# Patient Record
Sex: Female | Born: 1970
Health system: Southern US, Community
[De-identification: ages and names within clinical notes are randomized; demographics above are authoritative.]

## PROBLEM LIST (undated history)

## (undated) DIAGNOSIS — T7840XA Allergy, unspecified, initial encounter: Secondary | ICD-10-CM

## (undated) DIAGNOSIS — M1990 Unspecified osteoarthritis, unspecified site: Secondary | ICD-10-CM

## (undated) DIAGNOSIS — I10 Essential (primary) hypertension: Secondary | ICD-10-CM

## (undated) DIAGNOSIS — E785 Hyperlipidemia, unspecified: Secondary | ICD-10-CM

## (undated) HISTORY — DX: Allergy, unspecified, initial encounter: T78.40XA

## (undated) HISTORY — DX: Unspecified osteoarthritis, unspecified site: M19.90

## (undated) HISTORY — PX: ESOPHAGUS SURGERY: SHX626

## (undated) HISTORY — DX: Essential (primary) hypertension: I10

## (undated) HISTORY — PX: CERVIX SURGERY: SHX593

## (undated) HISTORY — DX: Hyperlipidemia, unspecified: E78.5

## (undated) HISTORY — PX: NO PAST SURGERIES: SHX2092

---

## 2004-03-14 ENCOUNTER — Ambulatory Visit: Payer: Self-pay | Admitting: Gastroenterology

## 2004-05-27 ENCOUNTER — Ambulatory Visit: Payer: Self-pay | Admitting: Gastroenterology

## 2006-06-27 ENCOUNTER — Emergency Department: Payer: Self-pay | Admitting: General Practice

## 2010-11-01 ENCOUNTER — Ambulatory Visit: Payer: Self-pay | Admitting: Family Medicine

## 2011-08-28 ENCOUNTER — Ambulatory Visit: Payer: Self-pay | Admitting: Family Medicine

## 2011-09-08 ENCOUNTER — Ambulatory Visit: Payer: Self-pay | Admitting: Family Medicine

## 2012-01-01 ENCOUNTER — Emergency Department: Payer: Self-pay | Admitting: Emergency Medicine

## 2012-01-01 LAB — PHOSPHORUS: Phosphorus: 1.2 mg/dL — ABNORMAL LOW (ref 2.5–4.9)

## 2012-01-01 LAB — CBC
MCHC: 32.4 g/dL (ref 32.0–36.0)
MCV: 82 fL (ref 80–100)
RDW: 16.2 % — ABNORMAL HIGH (ref 11.5–14.5)
WBC: 6.4 10*3/uL (ref 3.6–11.0)

## 2012-01-01 LAB — URINALYSIS, COMPLETE
Bilirubin,UR: NEGATIVE
Nitrite: NEGATIVE
Ph: 8 (ref 4.5–8.0)
Protein: 100
RBC,UR: 1 /HPF (ref 0–5)
WBC UR: 5 /HPF (ref 0–5)

## 2012-01-01 LAB — COMPREHENSIVE METABOLIC PANEL
Albumin: 3.8 g/dL (ref 3.4–5.0)
Alkaline Phosphatase: 75 U/L (ref 50–136)
BUN: 7 mg/dL (ref 7–18)
Bilirubin,Total: 0.5 mg/dL (ref 0.2–1.0)
Calcium, Total: 8.6 mg/dL (ref 8.5–10.1)
Chloride: 105 mmol/L (ref 98–107)
Co2: 22 mmol/L (ref 21–32)
Creatinine: 0.82 mg/dL (ref 0.60–1.30)
EGFR (African American): >60
EGFR (Non-African Amer.): >60
Glucose: 88 mg/dL (ref 65–99)
Potassium: 3.6 mmol/L (ref 3.5–5.1)
SGPT (ALT): 16 U/L (ref 12–78)
Total Protein: 7.9 g/dL (ref 6.4–8.2)

## 2012-01-01 LAB — TROPONIN I: Troponin-I: <0.02 ng/mL

## 2012-01-01 LAB — MAGNESIUM: Magnesium: 2.2 mg/dL

## 2012-03-25 ENCOUNTER — Ambulatory Visit: Payer: Self-pay | Admitting: General Surgery

## 2018-06-18 ENCOUNTER — Encounter: Payer: Self-pay | Admitting: Internal Medicine

## 2018-06-18 ENCOUNTER — Ambulatory Visit (INDEPENDENT_AMBULATORY_CARE_PROVIDER_SITE_OTHER): Payer: 59

## 2018-06-18 ENCOUNTER — Ambulatory Visit (INDEPENDENT_AMBULATORY_CARE_PROVIDER_SITE_OTHER): Payer: 59 | Admitting: Internal Medicine

## 2018-06-18 VITALS — BP 136/94 | HR 85 | Temp 98.5°F | Ht 65.5 in | Wt 284.4 lb

## 2018-06-18 DIAGNOSIS — J309 Allergic rhinitis, unspecified: Secondary | ICD-10-CM | POA: Insufficient documentation

## 2018-06-18 DIAGNOSIS — M25562 Pain in left knee: Secondary | ICD-10-CM

## 2018-06-18 DIAGNOSIS — Z124 Encounter for screening for malignant neoplasm of cervix: Secondary | ICD-10-CM | POA: Diagnosis not present

## 2018-06-18 DIAGNOSIS — Z1231 Encounter for screening mammogram for malignant neoplasm of breast: Secondary | ICD-10-CM | POA: Diagnosis not present

## 2018-06-18 DIAGNOSIS — G8929 Other chronic pain: Secondary | ICD-10-CM | POA: Insufficient documentation

## 2018-06-18 DIAGNOSIS — I10 Essential (primary) hypertension: Secondary | ICD-10-CM | POA: Diagnosis not present

## 2018-06-18 DIAGNOSIS — M25512 Pain in left shoulder: Secondary | ICD-10-CM

## 2018-06-18 DIAGNOSIS — M25561 Pain in right knee: Secondary | ICD-10-CM

## 2018-06-18 MED ORDER — MONTELUKAST SODIUM 10 MG PO TABS: 10.0000 mg | ORAL_TABLET | Freq: Every day | ORAL | 3 refills | Status: DC

## 2018-06-18 MED ORDER — ALBUTEROL SULFATE HFA 108 (90 BASE) MCG/ACT IN AERS: 1.0000 | INHALATION_SPRAY | Freq: Four times a day (QID) | RESPIRATORY_TRACT | 12 refills | Status: DC | PRN

## 2018-06-18 MED ORDER — LISINOPRIL-HYDROCHLOROTHIAZIDE 20-12.5 MG PO TABS: 1.0000 | ORAL_TABLET | Freq: Every day | ORAL | 1 refills | Status: DC

## 2018-06-18 MED ORDER — CETIRIZINE HCL 10 MG PO TABS: 10.0000 mg | ORAL_TABLET | Freq: Every day | ORAL | 3 refills | Status: DC

## 2018-06-18 NOTE — Progress Notes (Signed)
Pre visit review using our clinic review tool, if applicable. No additional management support is needed unless otherwise documented below in the visit note. 

## 2018-06-18 NOTE — Patient Instructions (Signed)
DASH Eating Plan DASH stands for "Dietary Approaches to Stop Hypertension." The DASH eating plan is a healthy eating plan that has been shown to reduce high blood pressure (hypertension). It may also reduce your risk for type 2 diabetes, heart disease, and stroke. The DASH eating plan may also help with weight loss. What are tips for following this plan?  General guidelines  Avoid eating more than 2,300 mg (milligrams) of salt (sodium) a day. If you have hypertension, you may need to reduce your sodium intake to 1,500 mg a day.  Limit alcohol intake to no more than 1 drink a day for nonpregnant women and 2 drinks a day for men. One drink equals 12 oz of beer, 5 oz of wine, or 1 oz of hard liquor.  Work with your health care provider to maintain a healthy body weight or to lose weight. Ask what an ideal weight is for you.  Get at least 30 minutes of exercise that causes your heart to beat faster (aerobic exercise) most days of the week. Activities may include walking, swimming, or biking.  Work with your health care provider or diet and nutrition specialist (dietitian) to adjust your eating plan to your individual calorie needs. Reading food labels   Check food labels for the amount of sodium per serving. Choose foods with less than 5 percent of the Daily Value of sodium. Generally, foods with less than 300 mg of sodium per serving fit into this eating plan.  To find whole grains, look for the word "whole" as the first word in the ingredient list. Shopping  Buy products labeled as "low-sodium" or "no salt added."  Buy fresh foods. Avoid canned foods and premade or frozen meals. Cooking  Avoid adding salt when cooking. Use salt-free seasonings or herbs instead of table salt or sea salt. Check with your health care provider or pharmacist before using salt substitutes.  Do not fry foods. Cook foods using healthy methods such as baking, boiling, grilling, and broiling instead.  Cook with  heart-healthy oils, such as olive, canola, soybean, or sunflower oil. Meal planning  Eat a balanced diet that includes: ? 5 or more servings of fruits and vegetables each day. At each meal, try to fill half of your plate with fruits and vegetables. ? Up to 6-8 servings of whole grains each day. ? Less than 6 oz of lean meat, poultry, or fish each day. A 3-oz serving of meat is about the same size as a deck of cards. One egg equals 1 oz. ? 2 servings of low-fat dairy each day. ? A serving of nuts, seeds, or beans 5 times each week. ? Heart-healthy fats. Healthy fats called Omega-3 fatty acids are found in foods such as flaxseeds and coldwater fish, like sardines, salmon, and mackerel.  Limit how much you eat of the following: ? Canned or prepackaged foods. ? Food that is high in trans fat, such as fried foods. ? Food that is high in saturated fat, such as fatty meat. ? Sweets, desserts, sugary drinks, and other foods with added sugar. ? Full-fat dairy products.  Do not salt foods before eating.  Try to eat at least 2 vegetarian meals each week.  Eat more home-cooked food and less restaurant, buffet, and fast food.  When eating at a restaurant, ask that your food be prepared with less salt or no salt, if possible. What foods are recommended? The items listed may not be a complete list. Talk with your dietitian about   what dietary choices are best for you. Grains Whole-grain or whole-wheat bread. Whole-grain or whole-wheat pasta. Brown rice. Oatmeal. Quinoa. Bulgur. Whole-grain and low-sodium cereals. Pita bread. Low-fat, low-sodium crackers. Whole-wheat flour tortillas. Vegetables Fresh or frozen vegetables (raw, steamed, roasted, or grilled). Low-sodium or reduced-sodium tomato and vegetable juice. Low-sodium or reduced-sodium tomato sauce and tomato paste. Low-sodium or reduced-sodium canned vegetables. Fruits All fresh, dried, or frozen fruit. Canned fruit in natural juice (without  added sugar). Meat and other protein foods Skinless chicken or turkey. Ground chicken or turkey. Pork with fat trimmed off. Fish and seafood. Egg whites. Dried beans, peas, or lentils. Unsalted nuts, nut butters, and seeds. Unsalted canned beans. Lean cuts of beef with fat trimmed off. Low-sodium, lean deli meat. Dairy Low-fat (1%) or fat-free (skim) milk. Fat-free, low-fat, or reduced-fat cheeses. Nonfat, low-sodium ricotta or cottage cheese. Low-fat or nonfat yogurt. Low-fat, low-sodium cheese. Fats and oils Soft margarine without trans fats. Vegetable oil. Low-fat, reduced-fat, or light mayonnaise and salad dressings (reduced-sodium). Canola, safflower, olive, soybean, and sunflower oils. Avocado. Seasoning and other foods Herbs. Spices. Seasoning mixes without salt. Unsalted popcorn and pretzels. Fat-free sweets. What foods are not recommended? The items listed may not be a complete list. Talk with your dietitian about what dietary choices are best for you. Grains Baked goods made with fat, such as croissants, muffins, or some breads. Dry pasta or rice meal packs. Vegetables Creamed or fried vegetables. Vegetables in a cheese sauce. Regular canned vegetables (not low-sodium or reduced-sodium). Regular canned tomato sauce and paste (not low-sodium or reduced-sodium). Regular tomato and vegetable juice (not low-sodium or reduced-sodium). Pickles. Olives. Fruits Canned fruit in a light or heavy syrup. Fried fruit. Fruit in cream or butter sauce. Meat and other protein foods Fatty cuts of meat. Ribs. Fried meat. Bacon. Sausage. Bologna and other processed lunch meats. Salami. Fatback. Hotdogs. Bratwurst. Salted nuts and seeds. Canned beans with added salt. Canned or smoked fish. Whole eggs or egg yolks. Chicken or turkey with skin. Dairy Whole or 2% milk, cream, and half-and-half. Whole or full-fat cream cheese. Whole-fat or sweetened yogurt. Full-fat cheese. Nondairy creamers. Whipped toppings.  Processed cheese and cheese spreads. Fats and oils Butter. Stick margarine. Lard. Shortening. Ghee. Bacon fat. Tropical oils, such as coconut, palm kernel, or palm oil. Seasoning and other foods Salted popcorn and pretzels. Onion salt, garlic salt, seasoned salt, table salt, and sea salt. Worcestershire sauce. Tartar sauce. Barbecue sauce. Teriyaki sauce. Soy sauce, including reduced-sodium. Steak sauce. Canned and packaged gravies. Fish sauce. Oyster sauce. Cocktail sauce. Horseradish that you find on the shelf. Ketchup. Mustard. Meat flavorings and tenderizers. Bouillon cubes. Hot sauce and Tabasco sauce. Premade or packaged marinades. Premade or packaged taco seasonings. Relishes. Regular salad dressings. Where to find more information:  National Heart, Lung, and Blood Institute: www.nhlbi.nih.gov  American Heart Association: www.heart.org Summary  The DASH eating plan is a healthy eating plan that has been shown to reduce high blood pressure (hypertension). It may also reduce your risk for type 2 diabetes, heart disease, and stroke.  With the DASH eating plan, you should limit salt (sodium) intake to 2,300 mg a day. If you have hypertension, you may need to reduce your sodium intake to 1,500 mg a day.  When on the DASH eating plan, aim to eat more fresh fruits and vegetables, whole grains, lean proteins, low-fat dairy, and heart-healthy fats.  Work with your health care provider or diet and nutrition specialist (dietitian) to adjust your eating plan to your   individual calorie needs. This information is not intended to replace advice given to you by your health care provider. Make sure you discuss any questions you have with your health care provider. Document Released: 04/24/2011 Document Revised: 04/28/2016 Document Reviewed: 04/28/2016 Elsevier Interactive Patient Education  2019 [employer].  Exercising to [employer] Exercise is structured, repetitive physical activity to improve  fitness and health. Getting regular exercise is important for everyone. It is especially important if you are overweight. Being overweight increases your risk of heart disease, stroke, diabetes, high blood pressure, and several types of cancer. Reducing your calorie intake and exercising can help you lose weight. Exercise is usually categorized as moderate or vigorous intensity. To lose weight, most people need to do a certain amount of moderate-intensity or vigorous-intensity exercise each week. Moderate-intensity exercise  Moderate-intensity exercise is any activity that gets you moving enough to burn at least three times more energy (calories) than if you were sitting. Examples of moderate exercise include:  Walking a mile in 15 minutes.  Doing light yard work.  Biking at an easy pace. Most people should get at least 150 minutes (2 hours and 30 minutes) a week of moderate-intensity exercise to maintain their body weight. Vigorous-intensity exercise Vigorous-intensity exercise is any activity that gets you moving enough to burn at least six times more calories than if you were sitting. When you exercise at this intensity, you should be working hard enough that you are not able to carry on a conversation. Examples of vigorous exercise include:  Running.  Playing a team sport, such as football, basketball, and soccer.  Jumping rope. Most people should get at least 75 minutes (1 hour and 15 minutes) a week of vigorous-intensity exercise to maintain their body weight. How can exercise affect me? When you exercise enough to burn more calories than you eat, you lose weight. Exercise also reduces body fat and builds muscle. The more muscle you have, the more calories you burn. Exercise also:  Improves mood.  Reduces stress and tension.  Improves your overall fitness, flexibility, and endurance.  Increases bone strength. The amount of exercise you need to lose weight depends on:  Your  age.  The type of exercise.  Any health conditions you have.  Your overall physical ability. Talk to your health care provider about how much exercise you need and what types of activities are safe for you. What actions can I take to lose weight? Nutrition   Make changes to your diet as told by your health care provider or diet and nutrition specialist (dietitian). This may include: ? Eating fewer calories. ? Eating more protein. ? Eating less unhealthy fats. ? Eating a diet that includes fresh fruits and vegetables, whole grains, low-fat dairy products, and lean protein. ? Avoiding foods with added fat, salt, and sugar.  Drink plenty of water while you exercise to prevent dehydration or heat stroke. Activity  Choose an activity that you enjoy and set realistic goals. Your health care provider can help you make an exercise plan that works for you.  Exercise at a moderate or vigorous intensity most days of the week. ? The intensity of exercise may vary from person to person. You can tell how intense a workout is for you by paying attention to your breathing and heartbeat. Most people will notice their breathing and heartbeat get faster with more intense exercise.  Do resistance training twice each week, such as: ? Push-ups. ? Sit-ups. ? Lifting weights. ? Using resistance  bands.  Getting short amounts of exercise can be just as helpful as long structured periods of exercise. If you have trouble finding time to exercise, try to include exercise in your daily routine. ? Get up, stretch, and walk around every 30 minutes throughout the day. ? Go for a walk during your lunch break. ? Park your car farther away from your destination. ? If you take public transportation, get off one stop early and walk the rest of the way. ? Make phone calls while standing up and walking around. ? Take the stairs instead of elevators or escalators.  Wear comfortable clothes and shoes with good  support.  Do not exercise so much that you hurt yourself, feel dizzy, or get very short of breath. Where to find more information  U.S. Department of Health and Human Services: [employer].at  Centers for Disease Control and Prevention (CDC): http://www.wolf.info/ Contact a health care provider:  Before starting a new exercise program.  If you have questions or concerns about your weight.  If you have a medical problem that keeps you from exercising. Get help right away if you have any of the following while exercising:  Injury.  Dizziness.  Difficulty breathing or shortness of breath that does not go away when you stop exercising.  Chest pain.  Rapid heartbeat. Summary  Being overweight increases your risk of heart disease, stroke, diabetes, high blood pressure, and several types of cancer.  Losing weight happens when you burn more calories than you eat.  Reducing the amount of calories you eat in addition to getting regular moderate or vigorous exercise each week helps you lose weight. This information is not intended to replace advice given to you by your health care provider. Make sure you discuss any questions you have with your health care provider. Document Released: 06/07/2010 Document Revised: 05/18/2017 Document Reviewed: 05/18/2017 Elsevier Interactive Patient Education  2019 [employer].

## 2018-06-18 NOTE — Progress Notes (Signed)
Chief Complaint  Patient presents with  . Establish Care   New Patient  1. HTN out of lis hctz 20/12.5 since 03/2018 needed PCP  2. Allergies needs refill of albuterol, singulair, zyrtec  3. C/o b/l knee pain and left shoulder pain worse last week 6/10 tried otc nsaid x 2 times    Review of Systems  Constitutional: Negative for weight loss.  HENT: Negative for hearing loss.   Eyes: Negative for blurred vision.  Respiratory: Negative for shortness of breath.   Cardiovascular: Negative for chest pain.  Gastrointestinal: Negative for abdominal pain.  Musculoskeletal: Positive for back pain and joint pain.  Skin: Negative for rash.  Neurological: Negative for headaches.  Psychiatric/Behavioral: Negative for depression.   Past Medical History:  Diagnosis Date  . Allergy   . Hypertension    Past Surgical History:  Procedure Laterality Date  . NO PAST SURGERIES     Family History  Problem Relation Age of Onset  . Hypertension Mother   . Hypertension Brother   . Hypertension Maternal Grandmother   . Hypertension Maternal Grandfather    Social History   Socioeconomic History  . Marital status: Married    Spouse name: Not on file  . Number of children: Not on file  . Years of education: Not on file  . Highest education level: Not on file  Occupational History  . Not on file  Social Needs  . Financial resource strain: Not on file  . Food insecurity:    Worry: Not on file    Inability: Not on file  . Transportation needs:    Medical: Not on file    Non-medical: Not on file  Tobacco Use  . Smoking status: Current Every Day Smoker  . Smokeless tobacco: Never Used  Substance and Sexual Activity  . Alcohol use: Not Currently  . Drug use: Not Currently  . Sexual activity: Yes    Comment: women   Lifestyle  . Physical activity:    Days per week: Not on file    Minutes per session: Not on file  . Stress: Not on file  Relationships  . Social connections:    Talks on  phone: Not on file    Gets together: Not on file    Attends religious service: Not on file    Active member of club or organization: Not on file    Attends meetings of clubs or organizations: Not on file    Relationship status: Not on file  . Intimate partner violence:    Fear of current or ex partner: Not on file    Emotionally abused: Not on file    Physically abused: Not on file    Forced sexual activity: Not on file  Other Topics Concern  . Not on file  Social History Narrative   Divorced    Prefers women   Smoker    Owns guns    Wears seat belt    Safe in relationship    Some college    Shop coordinator    Current Meds  Medication Sig  . albuterol (PROVENTIL HFA;VENTOLIN HFA) 108 (90 Base) MCG/ACT inhaler Inhale 1-2 puffs into the lungs every 6 (six) hours as needed for wheezing or shortness of breath.  . cetirizine (ZYRTEC) 10 MG tablet Take 1 tablet (10 mg total) by mouth daily.  . montelukast (SINGULAIR) 10 MG tablet Take 1 tablet (10 mg total) by mouth at bedtime.  . [DISCONTINUED] albuterol (PROVENTIL HFA;VENTOLIN HFA) 108 (90  Base) MCG/ACT inhaler Inhale 2 puffs into the lungs every 6 (six) hours as needed for wheezing or shortness of breath.  . [DISCONTINUED] cetirizine (ZYRTEC) 10 MG tablet Take 10 mg by mouth daily.  . [DISCONTINUED] LISINOPRIL-HYDROCHLOROTHIAZIDE PO Take 10-25 mg by mouth daily.  . [DISCONTINUED] montelukast (SINGULAIR) 10 MG tablet Take 10 mg by mouth at bedtime.   No Known Allergies No results found for this or any previous visit (from the past 2160 hour(s)). Objective  Body mass index is 46.61 kg/m. Wt Readings from Last 3 Encounters:  06/18/18 284 lb 6.4 oz (129 kg)   Temp Readings from Last 3 Encounters:  06/18/18 98.5 F (36.9 C) (Oral)   BP Readings from Last 3 Encounters:  06/18/18 (!) 136/94   Pulse Readings from Last 3 Encounters:  06/18/18 85    Physical Exam Vitals signs and nursing note reviewed.  Constitutional:       Appearance: Normal appearance. She is well-developed and well-groomed.  HENT:     Head: Normocephalic and atraumatic.     Nose: Nose normal.     Mouth/Throat:     Mouth: Mucous membranes are moist.     Pharynx: Oropharynx is clear.  Eyes:     Conjunctiva/sclera: Conjunctivae normal.     Pupils: Pupils are equal, round, and reactive to light.  Cardiovascular:     Rate and Rhythm: Normal rate and regular rhythm.     Heart sounds: Normal heart sounds. No murmur.  Pulmonary:     Effort: Pulmonary effort is normal.     Breath sounds: Normal breath sounds.  Skin:    General: Skin is warm and dry.  Neurological:     General: No focal deficit present.     Mental Status: She is alert and oriented to person, place, and time. Mental status is at baseline.     Gait: Gait normal.  Psychiatric:        Attention and Perception: Attention and perception normal.        Mood and Affect: Mood and affect normal.        Speech: Speech normal.        Behavior: Behavior normal. Behavior is cooperative.        Thought Content: Thought content normal.        Cognition and Memory: Cognition and memory normal.        Judgment: Judgment normal.     Assessment   1. htn  2. Allergic asthma  3.b/l knee pain and left shoulder pain  4. HM Plan   1. Refilled meds  2. Refilled meds did allergy shots in past  3 Xray left Knee rec prn tylenol  4.  Declines flu shot  Tdap had 06/08/14  Check MMR, hep B Declines STD check  Check CMET, CBC, lipid, TSH, T4, A1C, UA, vitamin D, MMR, hep B  Referred mammo due 08/18/2018  [facility]/gyn refer [facility]    Provider: Dr. Olivia Mackie McLean-Scocuzza-[facility]

## 2018-06-22 ENCOUNTER — Encounter: Payer: Self-pay | Admitting: Internal Medicine

## 2018-06-25 ENCOUNTER — Telehealth: Payer: Self-pay | Admitting: Internal Medicine

## 2018-06-25 DIAGNOSIS — E559 Vitamin D deficiency, unspecified: Secondary | ICD-10-CM

## 2018-06-25 MED ORDER — CHOLECALCIFEROL 1.25 MG (50000 UT) PO CAPS: 50000.0000 [IU] | ORAL_CAPSULE | ORAL | 1 refills | Status: DC

## 2018-06-25 NOTE — Telephone Encounter (Signed)
She is slightly anemic h/o 11.0/35.6  Liver kidneys normal  Urine normal  Cholesterol elevated TC 205, TG 148, HDL 39 low, LDL 136 high  -does she want to try cholesterol medication? -mail cholesterol handout  Protected mumps and measles  A1C 5.6  Protected hep B  Thyroid labs normal  Vitamin D low sent high dose D3 to pharmacy vitamin D 11.3   [place]

## 2018-07-01 NOTE — Telephone Encounter (Signed)
Pt returned call to office and given results and recommendations per Dr. Terese Door on 06/25/18. Pt verbalized understanding. Pt states she does want to try cholesterol medication and would like for prescription to be sent to [facility] on [address].   Pt also given xray results from 06/18/18 Notes recorded by McLean-Scocuzza, Nino Glow, MD on 06/21/2018 at 8:12 AM EST Moderate osteoarthritis in left knee  -does she want to see orthopedics?  She can try otc tylenol as needed, Tumeric capsules or glucosamine chondroitin  -does she want to try topical voltaren gel?   Pt states she does not want to see an orthopedic doctor at this time. Pt states she will try the otc medications and would also like to try the topical Voltaren gel.

## 2018-07-02 ENCOUNTER — Other Ambulatory Visit: Payer: Self-pay | Admitting: Internal Medicine

## 2018-07-02 DIAGNOSIS — E785 Hyperlipidemia, unspecified: Secondary | ICD-10-CM

## 2018-07-02 DIAGNOSIS — M1710 Unilateral primary osteoarthritis, unspecified knee: Secondary | ICD-10-CM

## 2018-07-02 MED ORDER — DICLOFENAC SODIUM 1 % TD GEL: 4.0000 g | Freq: Four times a day (QID) | TRANSDERMAL | 3 refills | Status: DC

## 2018-07-02 MED ORDER — ATORVASTATIN CALCIUM 10 MG PO TABS: 10.0000 mg | ORAL_TABLET | Freq: Every day | ORAL | 3 refills | Status: DC

## 2018-08-23 LAB — HM PAP SMEAR: HM Pap smear: NORMAL

## 2018-09-16 ENCOUNTER — Ambulatory Visit: Payer: 59 | Admitting: Internal Medicine

## 2018-09-16 ENCOUNTER — Ambulatory Visit (INDEPENDENT_AMBULATORY_CARE_PROVIDER_SITE_OTHER): Payer: 59 | Admitting: Internal Medicine

## 2018-09-16 DIAGNOSIS — Z1159 Encounter for screening for other viral diseases: Secondary | ICD-10-CM

## 2018-09-16 DIAGNOSIS — E559 Vitamin D deficiency, unspecified: Secondary | ICD-10-CM | POA: Diagnosis not present

## 2018-09-16 DIAGNOSIS — M25562 Pain in left knee: Secondary | ICD-10-CM

## 2018-09-16 DIAGNOSIS — K219 Gastro-esophageal reflux disease without esophagitis: Secondary | ICD-10-CM | POA: Diagnosis not present

## 2018-09-16 DIAGNOSIS — M25561 Pain in right knee: Secondary | ICD-10-CM | POA: Diagnosis not present

## 2018-09-16 DIAGNOSIS — I10 Essential (primary) hypertension: Secondary | ICD-10-CM

## 2018-09-16 DIAGNOSIS — E785 Hyperlipidemia, unspecified: Secondary | ICD-10-CM

## 2018-09-16 DIAGNOSIS — M25512 Pain in left shoulder: Secondary | ICD-10-CM

## 2018-09-16 DIAGNOSIS — D649 Anemia, unspecified: Secondary | ICD-10-CM

## 2018-09-16 MED ORDER — FAMOTIDINE 20 MG PO TABS: 20.0000 mg | ORAL_TABLET | Freq: Every day | ORAL | 3 refills | Status: DC

## 2018-09-17 DIAGNOSIS — D649 Anemia, unspecified: Secondary | ICD-10-CM | POA: Insufficient documentation

## 2018-09-17 DIAGNOSIS — E785 Hyperlipidemia, unspecified: Secondary | ICD-10-CM | POA: Insufficient documentation

## 2018-09-17 NOTE — Progress Notes (Signed)
Failed Doxy telephone note   I connected with Sally Harmon   on 09/16/18 at  3:15 PM EDT by telephone nd verified that I am speaking with the correct person using two identifiers.  Location patient: work Location provider:home Persons participating in the virtual visit: patient, provider  I discussed the limitations of evaluation and management by telemedicine and the availability of in person appointments. The patient expressed understanding and agreed to proceed.   HPI: HTN on zestoretic 20-12.5 w/o sxs of h/a or dizziness BP 08/23/2018 was 132/90 at ob/gyn appt she is taking meds  -reviewed labs 06/22/2018 [facility] mailed cholesterol handout today   GERD tried samples of Pepcid AC ? Dose and wants Rx she used to be on Nexium and Prevacid which helped she is trying to eat healthy (I.e eggs, bagels, protein shakes) and lose weight doing herbal life and down 5 lbs over the last 1 month.   Left Shoulder pain better after PT showed her exercises at work and moderate OA left knee better for now   ROS: See pertinent positives and negatives per HPI.  Past Medical History:  Diagnosis Date  . Allergy   . Hypertension     Past Surgical History:  Procedure Laterality Date  . NO PAST SURGERIES      Family History  Problem Relation Age of Onset  . Hypertension Mother   . Hypertension Brother   . Hypertension Maternal Grandmother   . Hypertension Maternal Grandfather     SOCIAL HX: 1 son  Working    Current Outpatient Medications:  .  albuterol (PROVENTIL HFA;VENTOLIN HFA) 108 (90 Base) MCG/ACT inhaler, Inhale 1-2 puffs into the lungs every 6 (six) hours as needed for wheezing or shortness of breath., Disp: 1 Inhaler, Rfl: 12 .  atorvastatin (LIPITOR) 10 MG tablet, Take 1 tablet (10 mg total) by mouth daily at 6 PM. At night, Disp: 90 tablet, Rfl: 3 .  cetirizine (ZYRTEC) 10 MG tablet, Take 1 tablet (10 mg total) by mouth daily., Disp: 90 tablet, Rfl: 3 .  Cholecalciferol 1.25 MG (50000  UT) capsule, Take 1 capsule (50,000 Units total) by mouth once a week., Disp: 13 capsule, Rfl: 1 .  diclofenac sodium (VOLTAREN) 1 % GEL, Apply 4 g topically 4 (four) times daily. Left knee arthritis, Disp: 100 g, Rfl: 3 .  famotidine (PEPCID) 20 MG tablet, Take 1 tablet (20 mg total) by mouth daily. In am, Disp: 90 tablet, Rfl: 3 .  lisinopril-hydrochlorothiazide (PRINZIDE,ZESTORETIC) 20-12.5 MG tablet, Take 1 tablet by mouth daily., Disp: 90 tablet, Rfl: 1 .  montelukast (SINGULAIR) 10 MG tablet, Take 1 tablet (10 mg total) by mouth at bedtime., Disp: 90 tablet, Rfl: 3  EXAM: able to see pt before failed video   VITALS per patient if applicable:  GENERAL: alert, oriented, appears well and in no acute distress  HEENT: atraumatic, conjunttiva clear, no obvious abnormalities on inspection of external nose and ears  NECK: normal movements of the head and neck  LUNGS: on inspection no signs of respiratory distress, breathing rate appears normal, no obvious gross SOB, gasping or wheezing  CV: no obvious cyanosis  MS: moves all visible extremities without noticeable abnormality  PSYCH/NEURO: pleasant and cooperative, no obvious depression or anxiety, speech and thought processing grossly intact  ASSESSMENT AND PLAN:  Discussed the following assessment and plan:  Essential hypertension -cont meds for now   Gastroesophageal reflux disease, esophagitis presence not specified - Plan: famotidine (PEPCID) 20 MG tablet qd consider  bid if this does not help was on prevacid and nexium in the past consider add ppi if bid H2 blocker not effective pt will let me know about 1x per day  Vitamin D deficiency  Pain in both knees, unspecified chronicity  Left shoulder pain, unspecified chronicity  HM Declines flu shot  Tdap had 06/08/14  Immune MMR, hep B qual + consider quant in future   Declines STD check  Referred mammo due 08/18/2018  Per pt this was resch 2/2 COVID June/july 2020  Pap  08/23/2018 [facility] neg pap neg HPV due in 3 years     I discussed the assessment and treatment plan with the patient. The patient was provided an opportunity to ask questions and all were answered. The patient agreed with the plan and demonstrated an understanding of the instructions.   The patient was advised to call back or seek an in-person evaluation if the symptoms worsen or if the condition fails to improve as anticipated.  Time spent 20 minutes  Delorise Jackson, MD

## 2018-09-17 NOTE — Patient Instructions (Signed)
Vitamin D Deficiency Vitamin D deficiency is when your body does not have enough vitamin D. Vitamin D is important to your body for many reasons:  It helps the body to absorb two important minerals, called calcium and phosphorus.  It plays a role in bone health.  It may help to prevent some diseases, such as diabetes and multiple sclerosis.  It plays a role in muscle function, including heart function. You can get vitamin D by:  Eating foods that naturally contain vitamin D.  Eating or drinking milk or other dairy products that have vitamin D added to them.  Taking a vitamin D supplement or a multivitamin supplement that contains vitamin D.  Being in the sun. Your body naturally makes vitamin D when your skin is exposed to sunlight. Your body changes the sunlight into a form of the vitamin that the body can use. If vitamin D deficiency is severe, it can cause a condition in which your bones become soft. In adults, this condition is called osteomalacia. In children, this condition is called rickets. What are the causes? Vitamin D deficiency may be caused by:  Not eating enough foods that contain vitamin D.  Not getting enough sun exposure.  Having certain digestive system diseases that make it difficult for your body to absorb vitamin D. These diseases include Crohn disease, chronic pancreatitis, and cystic fibrosis.  Having a surgery in which a part of the stomach or a part of the small intestine is removed.  Being obese.  Having chronic kidney disease or liver disease. What increases the risk? This condition is more likely to develop in:  Older people.  People who do not spend much time outdoors.  People who live in a long-term care facility.  People who have had broken bones.  People with weak or thin bones (osteoporosis).  People who have a disease or condition that changes how the body absorbs vitamin D.  People who have dark skin.  People who take certain  medicines, such as steroid medicines or certain seizure medicines.  People who are overweight or obese. What are the signs or symptoms? In mild cases of vitamin D deficiency, there may not be any symptoms. If the condition is severe, symptoms may include:  Bone pain.  Muscle pain.  Falling often.  Broken bones caused by a minor injury. How is this diagnosed? This condition is usually diagnosed with a blood test. How is this treated? Treatment for this condition may depend on what caused the condition. Treatment options include:  Taking vitamin D supplements.  Taking a calcium supplement. Your health care provider will suggest what dose is best for you. Follow these instructions at home:  Take medicines and supplements only as told by your health care provider.  Eat foods that contain vitamin D. Choices include: ? Fortified dairy products, cereals, or juices. Fortified means that vitamin D has been added to the food. Check the label on the package to be sure. ? Fatty fish, such as salmon or trout. ? Eggs. ? Oysters.  Do not use a tanning bed.  Maintain a healthy weight. Lose weight, if needed.  Keep all follow-up visits as told by your health care provider. This is important. Contact a health care provider if:  Your symptoms do not go away.  You feel like throwing up (nausea) or you throw up (vomit).  You have fewer bowel movements than usual or it is difficult for you to have a bowel movement (constipation). This information is   not intended to replace advice given to you by your health care provider. Make sure you discuss any questions you have with your health care provider. Document Released: 07/28/2011 Document Revised: 10/17/2015 Document Reviewed: 09/20/2014 Elsevier Interactive Patient Education  2019 [employer].  Gastroesophageal Reflux Disease, Adult Gastroesophageal reflux (GER) happens when acid from the stomach flows up into the tube that connects the  mouth and the stomach (esophagus). Normally, food travels down the esophagus and stays in the stomach to be digested. However, when a person has GER, food and stomach acid sometimes move back up into the esophagus. If this becomes a more serious problem, the person may be diagnosed with a disease called gastroesophageal reflux disease (GERD). GERD occurs when the reflux:  Happens often.  Causes frequent or severe symptoms.  Causes problems such as damage to the esophagus. When stomach acid comes in contact with the esophagus, the acid may cause soreness (inflammation) in the esophagus. Over time, GERD may create small holes (ulcers) in the lining of the esophagus. What are the causes? This condition is caused by a problem with the muscle between the esophagus and the stomach (lower esophageal sphincter, or LES). Normally, the LES muscle closes after food passes through the esophagus to the stomach. When the LES is weakened or abnormal, it does not close properly, and that allows food and stomach acid to go back up into the esophagus. The LES can be weakened by certain dietary substances, medicines, and medical conditions, including:  Tobacco use.  Pregnancy.  Having a hiatal hernia.  Alcohol use.  Certain foods and beverages, such as coffee, chocolate, onions, and peppermint. What increases the risk? You are more likely to develop this condition if you:  Have an increased body weight.  Have a connective tissue disorder.  Use NSAID medicines. What are the signs or symptoms? Symptoms of this condition include:  Heartburn.  Difficult or painful swallowing.  The feeling of having a lump in the throat.  Abitter taste in the mouth.  Bad breath.  Having a large amount of saliva.  Having an upset or bloated stomach.  Belching.  Chest pain. Different conditions can cause chest pain. Make sure you see your health care provider if you experience chest pain.  Shortness of  breath or wheezing.  Ongoing (chronic) cough or a night-time cough.  Wearing away of tooth enamel.  Weight loss. How is this diagnosed? Your health care provider will take a medical history and perform a physical exam. To determine if you have mild or severe GERD, your health care provider may also monitor how you respond to treatment. You may also have tests, including:  A test to examine your stomach and esophagus with a small camera (endoscopy).  A test thatmeasures the acidity level in your esophagus.  A test thatmeasures how much pressure is on your esophagus.  A barium swallow or modified barium swallow test to show the shape, size, and functioning of your esophagus. How is this treated? The goal of treatment is to help relieve your symptoms and to prevent complications. Treatment for this condition may vary depending on how severe your symptoms are. Your health care provider may recommend:  Changes to your diet.  Medicine.  Surgery. Follow these instructions at home: Eating and drinking   Follow a diet as recommended by your health care provider. This may involve avoiding foods and drinks such as: ? Coffee and tea (with or without caffeine). ? Drinks that containalcohol. ? Energy drinks and  sports drinks. ? Carbonated drinks or sodas. ? Chocolate and cocoa. ? Peppermint and mint flavorings. ? Garlic and onions. ? Horseradish. ? Spicy and acidic foods, including peppers, chili powder, curry powder, vinegar, hot sauces, and barbecue sauce. ? Citrus fruit juices and citrus fruits, such as oranges, lemons, and limes. ? Tomato-based foods, such as red sauce, chili, salsa, and pizza with red sauce. ? Fried and fatty foods, such as donuts, french fries, potato chips, and high-fat dressings. ? High-fat meats, such as hot dogs and fatty cuts of red and white meats, such as rib eye steak, sausage, ham, and bacon. ? High-fat dairy items, such as whole milk, butter, and cream  cheese.  Eat small, frequent meals instead of large meals.  Avoid drinking large amounts of liquid with your meals.  Avoid eating meals during the 2-3 hours before bedtime.  Avoid lying down right after you eat.  Do not exercise right after you eat. Lifestyle   Do not use any products that contain nicotine or tobacco, such as cigarettes, e-cigarettes, and chewing tobacco. If you need help quitting, ask your health care provider.  Try to reduce your stress by using methods such as yoga or meditation. If you need help reducing stress, ask your health care provider.  If you are overweight, reduce your weight to an amount that is healthy for you. Ask your health care provider for guidance about a safe weight loss goal. General instructions  Pay attention to any changes in your symptoms.  Take over-the-counter and prescription medicines only as told by your health care provider. Do not take aspirin, ibuprofen, or other NSAIDs unless your health care provider told you to do so.  Wear loose-fitting clothing. Do not wear anything tight around your waist that causes pressure on your abdomen.  Raise (elevate) the head of your bed about 6 inches (15 cm).  Avoid bending over if this makes your symptoms worse.  Keep all follow-up visits as told by your health care provider. This is important. Contact a health care provider if:  You have: ? New symptoms. ? Unexplained weight loss. ? Difficulty swallowing or it hurts to swallow. ? Wheezing or a persistent cough. ? A hoarse voice.  Your symptoms do not improve with treatment. Get help right away if you:  Have pain in your arms, neck, jaw, teeth, or back.  Feel sweaty, dizzy, or light-headed.  Have chest pain or shortness of breath.  Vomit and your vomit looks like blood or coffee grounds.  Faint.  Have stool that is bloody or black.  Cannot swallow, drink, or eat. Summary  Gastroesophageal reflux happens when acid from the  stomach flows up into the esophagus. GERD is a disease in which the reflux happens often, causes frequent or severe symptoms, or causes problems such as damage to the esophagus.  Treatment for this condition may vary depending on how severe your symptoms are. Your health care provider may recommend diet and lifestyle changes, medicine, or surgery.  Contact a health care provider if you have new or worsening symptoms.  Take over-the-counter and prescription medicines only as told by your health care provider. Do not take aspirin, ibuprofen, or other NSAIDs unless your health care provider told you to do so.  Keep all follow-up visits as told by your health care provider. This is important. This information is not intended to replace advice given to you by your health care provider. Make sure you discuss any questions you have with your health  care provider. Document Released: 02/12/2005 Document Revised: 11/11/2017 Document Reviewed: 11/11/2017 Elsevier Interactive Patient Education  2019 [place] for Gastroesophageal Reflux Disease, Adult When you have gastroesophageal reflux disease (GERD), the foods you eat and your eating habits are very important. Choosing the right foods can help ease your discomfort. Think about working with a nutrition specialist (dietitian) to help you make good choices. What are tips for following this plan?  Meals  Choose healthy foods that are low in fat, such as fruits, vegetables, whole grains, low-fat dairy products, and lean meat, fish, and poultry.  Eat small meals often instead of 3 large meals a day. Eat your meals slowly, and in a place where you are relaxed. Avoid bending over or lying down until 2-3 hours after eating.  Avoid eating meals 2-3 hours before bed.  Avoid drinking a lot of liquid with meals.  Cook foods using methods other than frying. Bake, grill, or broil food instead.  Avoid or limit: ? Chocolate. ? Peppermint or  spearmint. ? Alcohol. ? Pepper. ? Black and decaffeinated coffee. ? Black and decaffeinated tea. ? Bubbly (carbonated) soft drinks. ? Caffeinated energy drinks and soft drinks.  Limit high-fat foods such as: ? Fatty meat or fried foods. ? Whole milk, cream, butter, or ice cream. ? Nuts and nut butters. ? Pastries, donuts, and sweets made with butter or shortening.  Avoid foods that cause symptoms. These foods may be different for everyone. Common foods that cause symptoms include: ? Tomatoes. ? Oranges, lemons, and limes. ? Peppers. ? Spicy food. ? Onions and garlic. ? Vinegar. Lifestyle  Maintain a healthy weight. Ask your doctor what weight is healthy for you. If you need to lose weight, work with your doctor to do so safely.  Exercise for at least 30 minutes for 5 or more days each week, or as told by your doctor.  Wear loose-fitting clothes.  Do not smoke. If you need help quitting, ask your doctor.  Sleep with the head of your bed higher than your feet. Use a wedge under the mattress or blocks under the bed frame to raise the head of the bed. Summary  When you have gastroesophageal reflux disease (GERD), food and lifestyle choices are very important in easing your symptoms.  Eat small meals often instead of 3 large meals a day. Eat your meals slowly, and in a place where you are relaxed.  Limit high-fat foods such as fatty meat or fried foods.  Avoid bending over or lying down until 2-3 hours after eating.  Avoid peppermint and spearmint, caffeine, alcohol, and chocolate. This information is not intended to replace advice given to you by your health care provider. Make sure you discuss any questions you have with your health care provider. Document Released: 11/04/2011 Document Revised: 06/10/2016 Document Reviewed: 06/10/2016 Elsevier Interactive Patient Education  2019 [employer].

## 2018-12-08 DIAGNOSIS — Z1231 Encounter for screening mammogram for malignant neoplasm of breast: Secondary | ICD-10-CM | POA: Diagnosis not present

## 2018-12-08 LAB — HM MAMMOGRAPHY

## 2018-12-16 ENCOUNTER — Other Ambulatory Visit: Payer: Self-pay | Admitting: Internal Medicine

## 2018-12-16 DIAGNOSIS — I10 Essential (primary) hypertension: Secondary | ICD-10-CM

## 2018-12-16 MED ORDER — LISINOPRIL-HYDROCHLOROTHIAZIDE 20-12.5 MG PO TABS: 1.0000 | ORAL_TABLET | Freq: Every day | ORAL | 1 refills | Status: DC

## 2018-12-28 ENCOUNTER — Encounter: Payer: Self-pay | Admitting: Internal Medicine

## 2019-02-04 ENCOUNTER — Telehealth: Payer: Self-pay

## 2019-02-04 NOTE — Telephone Encounter (Signed)
mychart sent to inform patient. 

## 2019-02-04 NOTE — Telephone Encounter (Signed)
Copied from [place] [id number]. Topic: General - Inquiry >> Feb 04, 2019  3:47 PM Richardo Priest, [place] wrote: Reason for CRM: Patient called in stating her boil on her left nipple has returned and she would like the same antibiotic given, as before. Please advise. Call back is [phone].

## 2019-02-04 NOTE — Telephone Encounter (Signed)
She needs in person appt   [place]

## 2019-02-07 ENCOUNTER — Telehealth: Payer: Self-pay

## 2019-02-07 ENCOUNTER — Telehealth: Payer: Self-pay | Admitting: Internal Medicine

## 2019-02-07 ENCOUNTER — Other Ambulatory Visit: Payer: Self-pay

## 2019-02-07 NOTE — Telephone Encounter (Signed)
Copied from [place] [id number]. Topic: General - Inquiry >> Feb 04, 2019  3:47 PM Sally Harmon, [place] wrote: Reason for CRM: Patient called in stating her boil on her left nipple has returned and she would like the same antibiotic given, as before. Please advise. Call back is [phone]. >> Feb 04, 2019  5:17 PM [name], [name] wrote: Pt returned call to the office to schedule an appt with Dr. Olivia Mackie but there is no appt available for next week. Pt requests call back.

## 2019-02-07 NOTE — Telephone Encounter (Signed)
Needs in person visit see if another provider has availability for breast exam and further work up  (I.e [place] +/- mammogram) asap Forward message to provider pt is seeing   Thanks [place]

## 2019-02-07 NOTE — Telephone Encounter (Signed)
Duplicate message. Patient needs appointment. pcp not giving abx without being seen

## 2019-02-07 NOTE — Telephone Encounter (Signed)
Copied from [place] [id number]. Topic: Quick Communication - Rx Refill/Question >> Feb 07, 2019  9:35 AM Rainey Pines A wrote: Medication: Clindamycin (Patient would like a refill on medication for boil. Patient does not have appointment until 9/29. Patient stated that this is the same medication prescribed for her for the boil previously.)  Has the patient contacted their pharmacy? {Yes (Agent: If no, request that the patient contact the pharmacy for the refill.) (Agent: If yes, when and what did the pharmacy advise?)Contact PCP  Preferred Pharmacy (with phone number or street name): [id number] - [place], [place] - [address] [phone] (Phone) [phone] (Fax)    Agent: Please be advised that RX refills may take up to 3 business days. We ask that you follow-up with your pharmacy.

## 2019-02-07 NOTE — Telephone Encounter (Signed)
Is there somewhere you want to place her or do you want there to see another provider.

## 2019-02-07 NOTE — Telephone Encounter (Signed)
Patient requesting Clindamycin. Patient states that she has another boil. Please advise  [facility] [place], [place]

## 2019-02-08 ENCOUNTER — Other Ambulatory Visit: Payer: Self-pay

## 2019-02-08 ENCOUNTER — Ambulatory Visit (INDEPENDENT_AMBULATORY_CARE_PROVIDER_SITE_OTHER): Payer: BC Managed Care – PPO | Admitting: Internal Medicine

## 2019-02-08 VITALS — BP 168/96 | HR 89 | Temp 98.5°F | Ht 65.0 in | Wt 287.4 lb

## 2019-02-08 DIAGNOSIS — R87618 Other abnormal cytological findings on specimens from cervix uteri: Secondary | ICD-10-CM | POA: Diagnosis not present

## 2019-02-08 DIAGNOSIS — I10 Essential (primary) hypertension: Secondary | ICD-10-CM | POA: Diagnosis not present

## 2019-02-08 DIAGNOSIS — N61 Mastitis without abscess: Secondary | ICD-10-CM

## 2019-02-08 MED ORDER — LISINOPRIL-HYDROCHLOROTHIAZIDE 20-12.5 MG PO TABS: 2.0000 | ORAL_TABLET | Freq: Every day | ORAL | 1 refills | Status: DC

## 2019-02-08 MED ORDER — MUPIROCIN 2 % EX OINT: 1.0000 "application " | TOPICAL_OINTMENT | Freq: Two times a day (BID) | CUTANEOUS | 0 refills | Status: DC

## 2019-02-08 MED ORDER — DOXYCYCLINE HYCLATE 100 MG PO TABS: 100.0000 mg | ORAL_TABLET | Freq: Two times a day (BID) | ORAL | 0 refills | Status: DC

## 2019-02-08 NOTE — Progress Notes (Signed)
Chief Complaint  Patient presents with  . Follow-up   Left nipple pain x 1-2 weeks and 2 years ago had abscess on nipple given abx and topical Abx which helped but sx's returned and here to check out   HTN BP elevated today on lis 20-12.5 hctz   Review of Systems  Constitutional: Negative for weight loss.  HENT: Negative for hearing loss.   Eyes: Negative for blurred vision.  Respiratory: Negative for cough.   Cardiovascular: Negative for chest pain.  Gastrointestinal: Negative for abdominal pain.  Genitourinary:       +left nipple pain    Skin: Negative for rash.  Neurological: Negative for headaches.  Psychiatric/Behavioral: Negative for depression.   Past Medical History:  Diagnosis Date  . Allergy   . Hypertension    Past Surgical History:  Procedure Laterality Date  . NO PAST SURGERIES     Family History  Problem Relation Age of Onset  . Hypertension Mother   . Hypertension Brother   . Hypertension Maternal Grandmother   . Hypertension Maternal Grandfather    Social History   Socioeconomic History  . Marital status: Married    Spouse name: Not on file  . Number of children: Not on file  . Years of education: Not on file  . Highest education level: Not on file  Occupational History  . Not on file  Social Needs  . Financial resource strain: Not on file  . Food insecurity    Worry: Not on file    Inability: Not on file  . Transportation needs    Medical: Not on file    Non-medical: Not on file  Tobacco Use  . Smoking status: Current Every Day Smoker  . Smokeless tobacco: Never Used  Substance and Sexual Activity  . Alcohol use: Not Currently  . Drug use: Not Currently  . Sexual activity: Yes    Comment: women   Lifestyle  . Physical activity    Days per week: Not on file    Minutes per session: Not on file  . Stress: Not on file  Relationships  . Social Herbalist on phone: Not on file    Gets together: Not on file    Attends  religious service: Not on file    Active member of club or organization: Not on file    Attends meetings of clubs or organizations: Not on file    Relationship status: Not on file  . Intimate partner violence    Fear of current or ex partner: Not on file    Emotionally abused: Not on file    Physically abused: Not on file    Forced sexual activity: Not on file  Other Topics Concern  . Not on file  Social History Narrative   Divorced    Prefers women   Smoker    Owns guns    Wears seat belt    Safe in relationship    Some college    Shop coordinator    Current Meds  Medication Sig  . albuterol (PROVENTIL HFA;VENTOLIN HFA) 108 (90 Base) MCG/ACT inhaler Inhale 1-2 puffs into the lungs every 6 (six) hours as needed for wheezing or shortness of breath.  Marland Kitchen atorvastatin (LIPITOR) 10 MG tablet Take 1 tablet (10 mg total) by mouth daily at 6 PM. At night  . cetirizine (ZYRTEC) 10 MG tablet Take 1 tablet (10 mg total) by mouth daily.  . Cholecalciferol 1.25 MG (50000 UT) capsule Take  1 capsule (50,000 Units total) by mouth once a week.  . diclofenac sodium (VOLTAREN) 1 % GEL Apply 4 g topically 4 (four) times daily. Left knee arthritis  . famotidine (PEPCID) 20 MG tablet Take 1 tablet (20 mg total) by mouth daily. In am  . lisinopril-hydrochlorothiazide (ZESTORETIC) 20-12.5 MG tablet Take 2 tablets by mouth daily.  . montelukast (SINGULAIR) 10 MG tablet Take 1 tablet (10 mg total) by mouth at bedtime.  . [DISCONTINUED] lisinopril-hydrochlorothiazide (ZESTORETIC) 20-12.5 MG tablet Take 1 tablet by mouth daily.   No Known Allergies Recent Results (from the past 2160 hour(s))  HM MAMMOGRAPHY     Status: None   Collection Time: 12/08/18 12:00 AM  Result Value Ref Range   HM Mammogram 0-4 Bi-Rad 0-4 Bi-Rad, Self Reported Normal    Comment: negative [facility] 12/08/18    Objective  Body mass index is 47.83 kg/m. Wt Readings from Last 3 Encounters:  02/08/19 287 lb 6.4 oz (130.4 kg)   06/18/18 284 lb 6.4 oz (129 kg)   Temp Readings from Last 3 Encounters:  02/08/19 98.5 F (36.9 C) (Oral)  06/18/18 98.5 F (36.9 C) (Oral)   BP Readings from Last 3 Encounters:  02/08/19 (!) 168/96  06/18/18 (!) 136/94   Pulse Readings from Last 3 Encounters:  02/08/19 89  06/18/18 85    Physical Exam Vitals signs and nursing note reviewed.  Constitutional:      Appearance: Normal appearance. She is well-developed and well-groomed. She is morbidly obese.     Comments: +mask on    HENT:     Head: Normocephalic and atraumatic.  Cardiovascular:     Rate and Rhythm: Normal rate and regular rhythm.     Heart sounds: Normal heart sounds. No murmur.  Pulmonary:     Effort: Pulmonary effort is normal.     Breath sounds: Normal breath sounds.  Chest:     Breasts:        Left: Skin change and tenderness present. No mass or nipple discharge.    Lymphadenopathy:     Upper Body:     Left upper body: No axillary adenopathy.  Skin:    General: Skin is warm and dry.  Neurological:     General: No focal deficit present.     Mental Status: She is alert and oriented to person, place, and time. Mental status is at baseline.     Gait: Gait normal.  Psychiatric:        Attention and Perception: Attention and perception normal.        Mood and Affect: Mood and affect normal.        Speech: Speech normal.        Behavior: Behavior normal. Behavior is cooperative.        Thought Content: Thought content normal.        Cognition and Memory: Cognition and memory normal.        Judgment: Judgment normal.     Assessment  Plan  Mastitis - Plan: doxycycline (VIBRA-TABS) 100 MG tablet, mupirocin ointment (BACTROBAN) 2 %, MM DIAG BREAST TOMO UNI LEFT, US BREAST LTD UNI LEFT INC AXILLA -will do dx mammo and [place] in 10 days after abx   Essential hypertension - Plan: lisinopril-hydrochlorothiazide (ZESTORETIC) 20-12.5 MG tablet increase to 2 pills daily   Recheck at f/u  HM Declines flu  shot  Tdap had 06/08/14  Immune MMR, hep B qual + consider quant in future   Declines STD check  Referred  mammo due 08/18/2018  Per pt this was resch 2/2 COVID June/july 2020  [facility] 12/08/18 negative  -referred left mammo and [place] see abovee  Pap 08/23/2018 [facility] neg pap neg HPV due in 3 years but addendum stated + endometrial cells will refer back to ob/gyn   Provider: Dr. Olivia Mackie McLean-Scocuzza-Internal Medicine

## 2019-02-08 NOTE — Telephone Encounter (Signed)
Patient being seen today with pcp

## 2019-02-08 NOTE — Patient Instructions (Signed)
Mastitis  Mastitis is inflammation of the breast tissue. It occurs most often in women who are breastfeeding, but it can also affect other women, and sometimes even men. What are the causes? This condition is usually caused by a bacterial infection. Bacteria enter the breast tissue through cuts or openings in the skin. Typically, this occurs with breastfeeding because of cracked or irritated nipples. Sometimes, it can occur when there is no opening in the skin. This is usually caused by plugged milk ducts. Other causes include:  Nipple piercing.  Some forms of breast cancer. What are the signs or symptoms? Symptoms of this condition include:  Swelling, redness, tenderness, and pain in an area of the breast. The area may also feel warm to the touch. These symptoms usually affect the upper part of the breast, toward the armpit region.  Swelling of the glands under the arm on the same side.  Fever.  Rapid pulse.  Fatigue, headache, and flu-like muscle aches. If an infection is allowed to progress, a collection of pus (abscess) may develop. How is this diagnosed? This condition can usually be diagnosed based on a physical exam and your symptoms. You may also have other tests, such as:  Blood tests to determine if your body is fighting a bacterial infection.  Mammogram or ultrasound tests to rule out other problems or diseases.  Testing of pus and other fluids. Pus from the breast may be collected and examined in the lab. If an abscess has developed, the fluid in the abscess can be removed with a needle. This test can be used to confirm the diagnosis and identify the bacteria present.  If you are breastfeeding, breast milk may be cultured and tested for bacteria. How is this treated? Treatment for this condition may include:  Applying heat or cold compresses to the affected area.  Medicine for pain.  Antibiotic medicine to treat a bacterial infection. This is usually taken by  mouth.  Self-care such as rest and increased fluid intake.  If an abscess has developed, it may be treated by removing fluid with a needle. Mastitis that occurs with breastfeeding will sometimes go away on its own, so your health care provider may choose to wait 24 hours after first seeing you to decide whether a prescription medicine is needed. You may be told of different ways to help manage breastfeeding, such as continuing to breastfeed or pump in order to ensure adequate milk flow. Follow these instructions at home: Medicines  Take over-the-counter and prescription medicines only as told by your health care provider.  If you were prescribed an antibiotic medicine, take it as told by your health care provider. Do not stop taking the antibiotic even if you start to feel better. General instructions  Do not wear a tight or underwire bra. Wear a soft, supportive bra.  Increase your fluid intake, especially if you have a fever.  Get plenty of rest. If you are breastfeeding:  Continue to empty your breasts as often as possible either by breastfeeding or by using a breast pump. This will decrease the pressure and the pain that comes with it. Ask your health care provider if changes need to be made to your breastfeeding or pumping routine.  Keep your nipples clean and dry.  During breastfeeding, empty the first breast completely before going to the other breast. If your baby is not emptying your breasts completely, use a breast pump to empty your breasts.  Use breast massage during feeding or pumping sessions.    If directed, apply moist heat to the affected area of your breast right before breastfeeding or pumping. Use the heat source that your health care provider recommends.  If directed, put ice on the affected area of your breast right after breastfeeding or pumping: ? Put ice in a plastic bag. ? Place a towel between your skin and the bag. ? Leave the ice on for 20 minutes.  If  you go back to work, pump your breasts while at work to stay within your nursing schedule.  Avoid allowing your breasts to become overly filled with milk (engorged). Contact a health care provider if:  You have pus-like discharge from the breast.  You have a fever.  Your symptoms do not improve within 2 days of starting treatment. Get help right away if:  Your pain and swelling are getting worse.  You have pain that is not controlled with medicine.  You have a red line extending from the breast toward your armpit. Summary  Mastitis is inflammation of the breast tissue. It occurs most often in women who are breastfeeding, but it can also affect non-breastfeeding women and some men.  This condition is usually caused by a bacterial infection.  This condition may be treated with hot and cold compresses, medicines, self-care, and certain breastfeeding strategies.  If you were prescribed an antibiotic medicine, take it as told by your health care provider. Do not stop taking the antibiotic even if you start to feel better. This information is not intended to replace advice given to you by your health care provider. Make sure you discuss any questions you have with your health care provider. Document Released: 05/05/2005 Document Revised: 04/17/2017 Document Reviewed: 05/27/2016 Elsevier Patient Education  2020 [employer]  

## 2019-02-08 NOTE — Progress Notes (Signed)
Pre visit review using our clinic review tool, if applicable. No additional management support is needed unless otherwise documented below in the visit note. 

## 2019-02-14 ENCOUNTER — Other Ambulatory Visit: Payer: Self-pay

## 2019-02-15 ENCOUNTER — Encounter: Payer: Self-pay | Admitting: Internal Medicine

## 2019-02-15 ENCOUNTER — Other Ambulatory Visit: Payer: Self-pay

## 2019-02-15 ENCOUNTER — Ambulatory Visit (INDEPENDENT_AMBULATORY_CARE_PROVIDER_SITE_OTHER): Payer: BC Managed Care – PPO | Admitting: Internal Medicine

## 2019-02-15 VITALS — BP 174/100 | HR 89 | Temp 98.7°F | Ht 65.0 in | Wt 284.8 lb

## 2019-02-15 DIAGNOSIS — R87618 Other abnormal cytological findings on specimens from cervix uteri: Secondary | ICD-10-CM | POA: Diagnosis not present

## 2019-02-15 DIAGNOSIS — N644 Mastodynia: Secondary | ICD-10-CM | POA: Diagnosis not present

## 2019-02-15 DIAGNOSIS — I10 Essential (primary) hypertension: Secondary | ICD-10-CM | POA: Diagnosis not present

## 2019-02-15 DIAGNOSIS — Z0000 Encounter for general adult medical examination without abnormal findings: Secondary | ICD-10-CM

## 2019-02-15 MED ORDER — AMLODIPINE BESYLATE 5 MG PO TABS: 5.0000 mg | ORAL_TABLET | Freq: Every day | ORAL | 3 refills | Status: DC

## 2019-02-15 NOTE — Progress Notes (Signed)
Chief Complaint  Patient presents with  . Follow-up   F/u  1. Left breast pain improved on doxycycline going to [place] today to sign release for mammogram from [facility]  2. HTN elevated 178/100 repeat 174/100 on lis-hctz 20-12.5 2 pills daily she had beef lomain today and fried chicken wings lowest weight was 197 lbs in the past trying to exercise taking complete keto natural pills for weight loss  3. Annual   Review of Systems  Constitutional: Negative for weight loss.  HENT: Negative for hearing loss.   Eyes: Negative for blurred vision.  Respiratory: Negative for shortness of breath.   Cardiovascular: Negative for chest pain.  Gastrointestinal: Negative for abdominal pain.  Genitourinary:       +left breast pain better   Musculoskeletal: Negative for falls.  Skin: Negative for rash.  Neurological: Negative for headaches.  Psychiatric/Behavioral: Negative for depression.   Past Medical History:  Diagnosis Date  . Allergy   . Hypertension    Past Surgical History:  Procedure Laterality Date  . NO PAST SURGERIES     Family History  Problem Relation Age of Onset  . Hypertension Mother   . Hypertension Brother   . Hypertension Maternal Grandmother   . Hypertension Maternal Grandfather    Social History   Socioeconomic History  . Marital status: Married    Spouse name: Not on file  . Number of children: Not on file  . Years of education: Not on file  . Highest education level: Not on file  Occupational History  . Not on file  Social Needs  . Financial resource strain: Not on file  . Food insecurity    Worry: Not on file    Inability: Not on file  . Transportation needs    Medical: Not on file    Non-medical: Not on file  Tobacco Use  . Smoking status: Current Every Day Smoker  . Smokeless tobacco: Never Used  Substance and Sexual Activity  . Alcohol use: Not Currently  . Drug use: Not Currently  . Sexual activity: Yes    Comment: women   Lifestyle  .  Physical activity    Days per week: Not on file    Minutes per session: Not on file  . Stress: Not on file  Relationships  . Social Herbalist on phone: Not on file    Gets together: Not on file    Attends religious service: Not on file    Active member of club or organization: Not on file    Attends meetings of clubs or organizations: Not on file    Relationship status: Not on file  . Intimate partner violence    Fear of current or ex partner: Not on file    Emotionally abused: Not on file    Physically abused: Not on file    Forced sexual activity: Not on file  Other Topics Concern  . Not on file  Social History Narrative   Divorced    Prefers women   Smoker    Owns guns    Wears seat belt    Safe in relationship    Some college    Shop coordinator    Current Meds  Medication Sig  . albuterol (PROVENTIL HFA;VENTOLIN HFA) 108 (90 Base) MCG/ACT inhaler Inhale 1-2 puffs into the lungs every 6 (six) hours as needed for wheezing or shortness of breath.  Marland Kitchen atorvastatin (LIPITOR) 10 MG tablet Take 1 tablet (10 mg total) by  mouth daily at 6 PM. At night  . cetirizine (ZYRTEC) 10 MG tablet Take 1 tablet (10 mg total) by mouth daily.  . Cholecalciferol 1.25 MG (50000 UT) capsule Take 1 capsule (50,000 Units total) by mouth once a week.  . diclofenac sodium (VOLTAREN) 1 % GEL Apply 4 g topically 4 (four) times daily. Left knee arthritis  . doxycycline (VIBRA-TABS) 100 MG tablet Take 1 tablet (100 mg total) by mouth 2 (two) times daily. With food  . famotidine (PEPCID) 20 MG tablet Take 1 tablet (20 mg total) by mouth daily. In am  . lisinopril-hydrochlorothiazide (ZESTORETIC) 20-12.5 MG tablet Take 2 tablets by mouth daily.  . montelukast (SINGULAIR) 10 MG tablet Take 1 tablet (10 mg total) by mouth at bedtime.  . mupirocin ointment (BACTROBAN) 2 % Apply 1 application topically 2 (two) times daily. Left breast   No Known Allergies Recent Results (from the past 2160  hour(s))  HM MAMMOGRAPHY     Status: None   Collection Time: 12/08/18 12:00 AM  Result Value Ref Range   HM Mammogram 0-4 Bi-Rad 0-4 Bi-Rad, Self Reported Normal    Comment: negative [facility] 12/08/18    Objective  Body mass index is 47.39 kg/m. Wt Readings from Last 3 Encounters:  02/15/19 284 lb 12.8 oz (129.2 kg)  02/08/19 287 lb 6.4 oz (130.4 kg)  06/18/18 284 lb 6.4 oz (129 kg)   Temp Readings from Last 3 Encounters:  02/15/19 98.7 F (37.1 C) (Oral)  02/08/19 98.5 F (36.9 C) (Oral)  06/18/18 98.5 F (36.9 C) (Oral)   BP Readings from Last 3 Encounters:  02/15/19 (!) 174/100  02/08/19 (!) 168/96  06/18/18 (!) 136/94   Pulse Readings from Last 3 Encounters:  02/15/19 89  02/08/19 89  06/18/18 85    Physical Exam Vitals signs and nursing note reviewed.  Constitutional:      Appearance: Normal appearance. She is well-developed. She is morbidly obese.     Comments: +mask on    HENT:     Head: Normocephalic and atraumatic.  Eyes:     Conjunctiva/sclera: Conjunctivae normal.     Pupils: Pupils are equal, round, and reactive to light.  Cardiovascular:     Rate and Rhythm: Normal rate and regular rhythm.     Heart sounds: Normal heart sounds. No murmur.  Pulmonary:     Effort: Pulmonary effort is normal.     Breath sounds: Normal breath sounds.  Chest:     Breasts:        Left: Tenderness present.     Comments: Reduce ttp left nipple   Skin:    General: Skin is warm and dry.  Neurological:     General: No focal deficit present.     Mental Status: She is alert and oriented to person, place, and time. Mental status is at baseline.     Gait: Gait normal.  Psychiatric:        Attention and Perception: Attention and perception normal.        Mood and Affect: Mood and affect normal.        Speech: Speech normal.        Behavior: Behavior normal. Behavior is cooperative.        Thought Content: Thought content normal.        Cognition and Memory: Cognition and  memory normal.        Judgment: Judgment normal.     Assessment  Plan  Annual physical exam Labs 06/2018  She is slightly anemic h/o 11.0/35.6  Liver kidneys normal  Urine normal  Cholesterol elevated TC 205, TG 148, HDL 39 low, LDL 136 high  -does she want to try cholesterol medication? -mail cholesterol handout  Protected mumps and measles  A1C 5.6  Protected hep B  Thyroid labs normal  Vitamin D low sent high dose D3 to pharmacy vitamin D 11.3    Declines flu shot  Tdap had 06/08/14 ImmuneMMR, hep Bqual + consider quant in future  Declines STD check  Referred mammo due 4/1/2020Per pt this was resch 2/2 COVID June/july 2020  [facility] 12/08/18 negative  -referred left mammo and [place] see abovee  Pap 08/23/2018 [facility] neg pap neg HPV due in 3 yearsbut addendum stated + endometrial cells will refer back to ob/gyn just finished cycle at this time ob/gyn rec f/u in 1 year  -referred back due 08/23/19   Colonoscopy pt will message me if interested in near future disc today 02/15/19    Essential hypertension - Plan: amLODipine (NORVASC) 5 MG tablet Monitor at work 3x per week x 2 weeks and continue lis-hct 40/25 2 pills of 20-12.5 mg dose  Dash diet   Unexplained endometrial cells on cervical cytology - Plan: Ambulatory referral to Obstetrics / Gynecology  Breast pain, left -pending left breast dx mammo and [place]    Provider: Dr. Olivia Mackie McLean-Scocuzza-Internal Medicine

## 2019-02-15 NOTE — Progress Notes (Signed)
Pre visit review using our clinic review tool, if applicable. No additional management support is needed unless otherwise documented below in the visit note. 

## 2019-02-15 NOTE — Patient Instructions (Addendum)
Hibiscus tea with no sugar for blood pressure lowering   If blood pressure >130/>80 please start new pill norvasc 5 mg daily (try this at night) with 2 pills of lisinopril hctz in the am    Low-Sodium Eating Plan Sodium, which is an element that makes up salt, helps you maintain a healthy balance of fluids in your body. Too much sodium can increase your blood pressure and cause fluid and waste to be held in your body. Your health care provider or dietitian may recommend following this plan if you have high blood pressure (hypertension), kidney disease, liver disease, or heart failure. Eating less sodium can help lower your blood pressure, reduce swelling, and protect your heart, liver, and kidneys. What are tips for following this plan? General guidelines  Most people on this plan should limit their sodium intake to 1,500-2,000 mg (milligrams) of sodium each day. Reading food labels   The Nutrition Facts label lists the amount of sodium in one serving of the food. If you eat more than one serving, you must multiply the listed amount of sodium by the number of servings.  Choose foods with less than 140 mg of sodium per serving.  Avoid foods with 300 mg of sodium or more per serving. Shopping  Look for lower-sodium products, often labeled as "low-sodium" or "no salt added."  Always check the sodium content even if foods are labeled as "unsalted" or "no salt added".  Buy fresh foods. ? Avoid canned foods and premade or frozen meals. ? Avoid canned, cured, or processed meats  Buy breads that have less than 80 mg of sodium per slice. Cooking  Eat more home-cooked food and less restaurant, buffet, and fast food.  Avoid adding salt when cooking. Use salt-free seasonings or herbs instead of table salt or sea salt. Check with your health care provider or pharmacist before using salt substitutes.  Cook with plant-based oils, such as canola, sunflower, or olive oil. Meal planning  When  eating at a restaurant, ask that your food be prepared with less salt or no salt, if possible.  Avoid foods that contain MSG (monosodium glutamate). MSG is sometimes added to [place] food, bouillon, and some canned foods. What foods are recommended? The items listed may not be a complete list. Talk with your dietitian about what dietary choices are best for you. Grains Low-sodium cereals, including oats, puffed wheat and rice, and shredded wheat. Low-sodium crackers. Unsalted rice. Unsalted pasta. Low-sodium bread. Whole-grain breads and whole-grain pasta. Vegetables Fresh or frozen vegetables. "No salt added" canned vegetables. "No salt added" tomato sauce and paste. Low-sodium or reduced-sodium tomato and vegetable juice. Fruits Fresh, frozen, or canned fruit. Fruit juice. Meats and other protein foods Fresh or frozen (no salt added) meat, poultry, seafood, and fish. Low-sodium canned tuna and salmon. Unsalted nuts. Dried peas, beans, and lentils without added salt. Unsalted canned beans. Eggs. Unsalted nut butters. Dairy Milk. Soy milk. Cheese that is naturally low in sodium, such as ricotta cheese, fresh mozzarella, or Swiss cheese Low-sodium or reduced-sodium cheese. Cream cheese. Yogurt. Fats and oils Unsalted butter. Unsalted margarine with no trans fat. Vegetable oils such as canola or olive oils. Seasonings and other foods Fresh and dried herbs and spices. Salt-free seasonings. Low-sodium mustard and ketchup. Sodium-free salad dressing. Sodium-free light mayonnaise. Fresh or refrigerated horseradish. Lemon juice. Vinegar. Homemade, reduced-sodium, or low-sodium soups. Unsalted popcorn and pretzels. Low-salt or salt-free chips. What foods are not recommended? The items listed may not be a complete list.  Talk with your dietitian about what dietary choices are best for you. Grains Instant hot cereals. Bread stuffing, pancake, and biscuit mixes. Croutons. Seasoned rice or pasta mixes.  Noodle soup cups. Boxed or frozen macaroni and cheese. Regular salted crackers. Self-rising flour. Vegetables Sauerkraut, pickled vegetables, and relishes. Olives. [place] fries. Onion rings. Regular canned vegetables (not low-sodium or reduced-sodium). Regular canned tomato sauce and paste (not low-sodium or reduced-sodium). Regular tomato and vegetable juice (not low-sodium or reduced-sodium). Frozen vegetables in sauces. Meats and other protein foods Meat or fish that is salted, canned, smoked, spiced, or pickled. Bacon, ham, sausage, hotdogs, corned beef, chipped beef, packaged lunch meats, salt pork, jerky, pickled herring, anchovies, regular canned tuna, sardines, salted nuts. Dairy Processed cheese and cheese spreads. Cheese curds. Blue cheese. Feta cheese. String cheese. Regular cottage cheese. Buttermilk. Canned milk. Fats and oils Salted butter. Regular margarine. Ghee. Bacon fat. Seasonings and other foods Onion salt, garlic salt, seasoned salt, table salt, and sea salt. Canned and packaged gravies. Worcestershire sauce. Tartar sauce. Barbecue sauce. Teriyaki sauce. Soy sauce, including reduced-sodium. Steak sauce. Fish sauce. Oyster sauce. Cocktail sauce. Horseradish that you find on the shelf. Regular ketchup and mustard. Meat flavorings and tenderizers. Bouillon cubes. Hot sauce and Tabasco sauce. Premade or packaged marinades. Premade or packaged taco seasonings. Relishes. Regular salad dressings. Salsa. Potato and tortilla chips. Corn chips and puffs. Salted popcorn and pretzels. Canned or dried soups. Pizza. Frozen entrees and pot pies. Summary  Eating less sodium can help lower your blood pressure, reduce swelling, and protect your heart, liver, and kidneys.  Most people on this plan should limit their sodium intake to 1,500-2,000 mg (milligrams) of sodium each day.  Canned, boxed, and frozen foods are high in sodium. Restaurant foods, fast foods, and pizza are also very high in  sodium. You also get sodium by adding salt to food.  Try to cook at home, eat more fresh fruits and vegetables, and eat less fast food, canned, processed, or prepared foods. This information is not intended to replace advice given to you by your health care provider. Make sure you discuss any questions you have with your health care provider. Document Released: 10/25/2001 Document Revised: 04/17/2017 Document Reviewed: 04/28/2016 Elsevier Patient Education  2020 [employer] DASH stands for "Dietary Approaches to Stop Hypertension." The DASH eating plan is a healthy eating plan that has been shown to reduce high blood pressure (hypertension). It may also reduce your risk for type 2 diabetes, heart disease, and stroke. The DASH eating plan may also help with weight loss. What are tips for following this plan?  General guidelines  Avoid eating more than 2,300 mg (milligrams) of salt (sodium) a day. If you have hypertension, you may need to reduce your sodium intake to 1,500 mg a day.  Limit alcohol intake to no more than 1 drink a day for nonpregnant women and 2 drinks a day for men. One drink equals 12 oz of beer, 5 oz of wine, or 1 oz of hard liquor.  Work with your health care provider to maintain a healthy body weight or to lose weight. Ask what an ideal weight is for you.  Get at least 30 minutes of exercise that causes your heart to beat faster (aerobic exercise) most days of the week. Activities may include walking, swimming, or biking.  Work with your health care provider or diet and nutrition specialist (dietitian) to adjust your eating plan to your individual calorie needs. Reading  food labels   Check food labels for the amount of sodium per serving. Choose foods with less than 5 percent of the Daily Value of sodium. Generally, foods with less than 300 mg of sodium per serving fit into this eating plan.  To find whole grains, look for the word "whole" as the  first word in the ingredient list. Shopping  Buy products labeled as "low-sodium" or "no salt added."  Buy fresh foods. Avoid canned foods and premade or frozen meals. Cooking  Avoid adding salt when cooking. Use salt-free seasonings or herbs instead of table salt or sea salt. Check with your health care provider or pharmacist before using salt substitutes.  Do not fry foods. Cook foods using healthy methods such as baking, boiling, grilling, and broiling instead.  Cook with heart-healthy oils, such as olive, canola, soybean, or sunflower oil. Meal planning  Eat a balanced diet that includes: ? 5 or more servings of fruits and vegetables each day. At each meal, try to fill half of your plate with fruits and vegetables. ? Up to 6-8 servings of whole grains each day. ? Less than 6 oz of lean meat, poultry, or fish each day. A 3-oz serving of meat is about the same size as a deck of cards. One egg equals 1 oz. ? 2 servings of low-fat dairy each day. ? A serving of nuts, seeds, or beans 5 times each week. ? Heart-healthy fats. Healthy fats called Omega-3 fatty acids are found in foods such as flaxseeds and coldwater fish, like sardines, salmon, and mackerel.  Limit how much you eat of the following: ? Canned or prepackaged foods. ? Food that is high in trans fat, such as fried foods. ? Food that is high in saturated fat, such as fatty meat. ? Sweets, desserts, sugary drinks, and other foods with added sugar. ? Full-fat dairy products.  Do not salt foods before eating.  Try to eat at least 2 vegetarian meals each week.  Eat more home-cooked food and less restaurant, buffet, and fast food.  When eating at a restaurant, ask that your food be prepared with less salt or no salt, if possible. What foods are recommended? The items listed may not be a complete list. Talk with your dietitian about what dietary choices are best for you. Grains Whole-grain or whole-wheat bread. Whole-grain  or whole-wheat pasta. Brown rice. [place]. Bulgur. Whole-grain and low-sodium cereals. Pita bread. Low-fat, low-sodium crackers. Whole-wheat flour tortillas. Vegetables Fresh or frozen vegetables (raw, steamed, roasted, or grilled). Low-sodium or reduced-sodium tomato and vegetable juice. Low-sodium or reduced-sodium tomato sauce and tomato paste. Low-sodium or reduced-sodium canned vegetables. Fruits All fresh, dried, or frozen fruit. Canned fruit in natural juice (without added sugar). Meat and other protein foods Skinless chicken or [place]. Ground chicken or [place]. Pork with fat trimmed off. Fish and seafood. Egg whites. Dried beans, peas, or lentils. Unsalted nuts, nut butters, and seeds. Unsalted canned beans. Lean cuts of beef with fat trimmed off. Low-sodium, lean deli meat. Dairy Low-fat (1%) or fat-free (skim) milk. Fat-free, low-fat, or reduced-fat cheeses. Nonfat, low-sodium ricotta or cottage cheese. Low-fat or nonfat yogurt. Low-fat, low-sodium cheese. Fats and oils Soft margarine without trans fats. Vegetable oil. Low-fat, reduced-fat, or light mayonnaise and salad dressings (reduced-sodium). Canola, safflower, olive, soybean, and sunflower oils. Avocado. Seasoning and other foods Herbs. Spices. Seasoning mixes without salt. Unsalted popcorn and pretzels. Fat-free sweets. What foods are not recommended? The items listed may not be a complete list. Talk  with your dietitian about what dietary choices are best for you. Grains Baked goods made with fat, such as croissants, muffins, or some breads. Dry pasta or rice meal packs. Vegetables Creamed or fried vegetables. Vegetables in a cheese sauce. Regular canned vegetables (not low-sodium or reduced-sodium). Regular canned tomato sauce and paste (not low-sodium or reduced-sodium). Regular tomato and vegetable juice (not low-sodium or reduced-sodium). Angie Fava. Olives. Fruits Canned fruit in a light or heavy syrup. Fried fruit.  Fruit in cream or butter sauce. Meat and other protein foods Fatty cuts of meat. Ribs. Fried meat. Berniece Salines. Sausage. Bologna and other processed lunch meats. Salami. Fatback. Hotdogs. Bratwurst. Salted nuts and seeds. Canned beans with added salt. Canned or smoked fish. Whole eggs or egg yolks. Chicken or [place] with skin. Dairy Whole or 2% milk, cream, and half-and-half. Whole or full-fat cream cheese. Whole-fat or sweetened yogurt. Full-fat cheese. Nondairy creamers. Whipped toppings. Processed cheese and cheese spreads. Fats and oils Butter. Stick margarine. Lard. Shortening. Ghee. Bacon fat. Tropical oils, such as coconut, palm kernel, or palm oil. Seasoning and other foods Salted popcorn and pretzels. Onion salt, garlic salt, seasoned salt, table salt, and sea salt. Worcestershire sauce. Tartar sauce. Barbecue sauce. Teriyaki sauce. Soy sauce, including reduced-sodium. Steak sauce. Canned and packaged gravies. Fish sauce. Oyster sauce. Cocktail sauce. Horseradish that you find on the shelf. Ketchup. Mustard. Meat flavorings and tenderizers. Bouillon cubes. Hot sauce and Tabasco sauce. Premade or packaged marinades. Premade or packaged taco seasonings. Relishes. Regular salad dressings. Where to find more information:  National Heart, Lung, and [place]: https://wilson-eaton.com/  American Heart Association: www.heart.org Summary  The DASH eating plan is a healthy eating plan that has been shown to reduce high blood pressure (hypertension). It may also reduce your risk for type 2 diabetes, heart disease, and stroke.  With the DASH eating plan, you should limit salt (sodium) intake to 2,300 mg a day. If you have hypertension, you may need to reduce your sodium intake to 1,500 mg a day.  When on the DASH eating plan, aim to eat more fresh fruits and vegetables, whole grains, lean proteins, low-fat dairy, and heart-healthy fats.  Work with your health care provider or diet and nutrition  specialist (dietitian) to adjust your eating plan to your individual calorie needs. This information is not intended to replace advice given to you by your health care provider. Make sure you discuss any questions you have with your health care provider. Document Released: 04/24/2011 Document Revised: 04/17/2017 Document Reviewed: 04/28/2016 Elsevier Patient Education  2020 [employer].

## 2019-02-21 ENCOUNTER — Inpatient Hospital Stay
Admission: RE | Admit: 2019-02-21 | Discharge: 2019-02-21 | Disposition: A | Discharge status: Home / Self Care | Payer: Self-pay | Source: Ambulatory Visit | Attending: Internal Medicine | Admitting: Internal Medicine

## 2019-02-21 ENCOUNTER — Other Ambulatory Visit: Payer: Self-pay | Admitting: Internal Medicine

## 2019-02-21 DIAGNOSIS — Z1231 Encounter for screening mammogram for malignant neoplasm of breast: Secondary | ICD-10-CM

## 2019-05-25 ENCOUNTER — Other Ambulatory Visit: Payer: Self-pay

## 2019-05-25 ENCOUNTER — Ambulatory Visit (INDEPENDENT_AMBULATORY_CARE_PROVIDER_SITE_OTHER): Payer: BC Managed Care – PPO | Admitting: Internal Medicine

## 2019-05-25 VITALS — BP 150/80 | Ht 63.0 in | Wt 284.0 lb

## 2019-05-25 DIAGNOSIS — E785 Hyperlipidemia, unspecified: Secondary | ICD-10-CM

## 2019-05-25 DIAGNOSIS — I10 Essential (primary) hypertension: Secondary | ICD-10-CM | POA: Diagnosis not present

## 2019-05-25 DIAGNOSIS — Z1389 Encounter for screening for other disorder: Secondary | ICD-10-CM

## 2019-05-25 DIAGNOSIS — N644 Mastodynia: Secondary | ICD-10-CM

## 2019-05-25 DIAGNOSIS — D649 Anemia, unspecified: Secondary | ICD-10-CM

## 2019-05-25 DIAGNOSIS — Z1329 Encounter for screening for other suspected endocrine disorder: Secondary | ICD-10-CM

## 2019-05-25 DIAGNOSIS — E559 Vitamin D deficiency, unspecified: Secondary | ICD-10-CM

## 2019-05-25 NOTE — Progress Notes (Signed)
Virtual Visit via Video Note  I connected with Sally Harmon   on 05/25/19 at  3:51 PM EST by a video enabled telemedicine application and verified that I am speaking with the correct person using two identifiers.  Location patient: work Environmental manager or home office Persons participating in the virtual visit: patient, provider  I discussed the limitations of evaluation and management by telemedicine and the availability of in person appointments. The patient expressed understanding and agreed to proceed.   HPI: 1 HTN on lis-hctz 20-12.5 2 pills daily pt never picked up norvasc 5 mg qd BP checked at work 150/80 she requests norvasc 5 mg Rx be resent as she lost Rx  She reports she was eating Raman noodles high in sodium  2. Left breast pain resolved after Abx h/o cyst/abscess in 2016 doxycycline improved and she never had left sx mammo and [place] but agreeable to schedule  3. Vitamin D def need to repeat D3 level and consider taking 1x per month pending labs    ROS: See pertinent positives and negatives per HPI.  Past Medical History:  Diagnosis Date  . Allergy   . Hypertension     Past Surgical History:  Procedure Laterality Date  . NO PAST SURGERIES      Family History  Problem Relation Age of Onset  . Hypertension Mother   . Hypertension Brother   . Hypertension Maternal Grandmother   . Hypertension Maternal Grandfather     SOCIAL HX:  Divorced  Prefers women Smoker  Owns guns  Wears seat belt  Safe in relationship  Some Psychologist, sport and exercise  Current Outpatient Medications:  .  albuterol (PROVENTIL HFA;VENTOLIN HFA) 108 (90 Base) MCG/ACT inhaler, Inhale 1-2 puffs into the lungs every 6 (six) hours as needed for wheezing or shortness of breath., Disp: 1 Inhaler, Rfl: 12 .  atorvastatin (LIPITOR) 10 MG tablet, Take 1 tablet (10 mg total) by mouth daily at 6 PM. At night, Disp: 90 tablet, Rfl: 3 .  cetirizine (ZYRTEC) 10 MG tablet, Take 1 tablet (10 mg  total) by mouth daily., Disp: 90 tablet, Rfl: 3 .  Cholecalciferol 1.25 MG (50000 UT) capsule, Take 1 capsule (50,000 Units total) by mouth once a week., Disp: 13 capsule, Rfl: 1 .  diclofenac sodium (VOLTAREN) 1 % GEL, Apply 4 g topically 4 (four) times daily. Left knee arthritis, Disp: 100 g, Rfl: 3 .  doxycycline (VIBRA-TABS) 100 MG tablet, Take 1 tablet (100 mg total) by mouth 2 (two) times daily. With food, Disp: 20 tablet, Rfl: 0 .  famotidine (PEPCID) 20 MG tablet, Take 1 tablet (20 mg total) by mouth daily. In am, Disp: 90 tablet, Rfl: 3 .  lisinopril-hydrochlorothiazide (ZESTORETIC) 20-12.5 MG tablet, Take 2 tablets by mouth daily., Disp: 180 tablet, Rfl: 1 .  montelukast (SINGULAIR) 10 MG tablet, Take 1 tablet (10 mg total) by mouth at bedtime., Disp: 90 tablet, Rfl: 3 .  mupirocin ointment (BACTROBAN) 2 %, Apply 1 application topically 2 (two) times daily. Left breast, Disp: 30 g, Rfl: 0 .  amLODipine (NORVASC) 5 MG tablet, Take 1 tablet (5 mg total) by mouth daily., Disp: 90 tablet, Rfl: 3  EXAM:  VITALS per patient if applicable:  GENERAL: alert, oriented, appears well and in no acute distress  HEENT: atraumatic, conjunttiva clear, no obvious abnormalities on inspection of external nose and ears  NECK: normal movements of the head and neck  LUNGS: on inspection no signs of respiratory distress, breathing rate appears  normal, no obvious gross SOB, gasping or wheezing  CV: no obvious cyanosis  MS: moves all visible extremities without noticeable abnormality  PSYCH/NEURO: pleasant and cooperative, no obvious depression or anxiety, speech and thought processing grossly intact  ASSESSMENT AND PLAN:  Discussed the following assessment and plan:  Essential hypertension - Plan: Comprehensive metabolic panel, CBC w/Diff, Lipid panel [facility] or with work pt to come pick up lab req.  Cont lis hct 40-25 mg qd  Add norvasc 5 mg qd sent to pharmacy   Hyperlipidemia Given info  dash and hld  Anemia, unspecified type - Plan: CBC w/Diff, Iron, TIBC and Ferritin Panel  Vitamin D deficiency - Plan: Vitamin D (25 hydroxy) Consider 1x per month D3 50K pending Vit D results   Breast pain, left Noted 01/2019 responded to Doxycycline but recurrent since 2016  Need to sch [place] and dx mammo left breast [facility]  mammo 12/24/18 [facility] negative   HM Declines flu shot  Tdap had 06/08/14 ImmuneMMR, hep Bqual + consider quant in future Fasting labs with [facility] will give form 05/2019  Declines STD check  Referred mammo due 4/1/2020Per pt this was resch 2/2 COVID June/july 2020 [facility] mammo 12/08/18 negative  -referred left mammo and [place] see abovee  Pap 08/23/2018 [facility] neg pap neg HPV due in 3 yearsbut addendum stated + endometrial cells will refer back to ob/gynjust finished cycle at this time ob/gyn rec f/u in 1 year  -referred back due 08/23/19 [facility] appt sch 08/24/19 Dr. Leonides Schanz   Colonoscopy pt will message me if interested in near future disc 01/2019 and 05/25/19 pt wants to wait for now but let me know when ready    -we discussed possible serious and likely etiologies, options for evaluation and workup, limitations of telemedicine visit vs in person visit, treatment, treatment risks and precautions. Pt prefers to treat via telemedicine empirically rather then risking or undertaking an in person visit at this moment. Patient agrees to seek prompt in person care if worsening, new symptoms arise, or if is not improving with treatment.   I discussed the assessment and treatment plan with the patient. The patient was provided an opportunity to ask questions and all were answered. The patient agreed with the plan and demonstrated an understanding of the instructions.   The patient was advised to call back or seek an in-person evaluation if the symptoms worsen or if the condition fails to improve as anticipated.  Time spent 30 minutes  Delorise Jackson, MD

## 2019-06-03 ENCOUNTER — Other Ambulatory Visit: Payer: Self-pay | Admitting: Internal Medicine

## 2019-06-03 ENCOUNTER — Encounter: Payer: Self-pay | Admitting: Internal Medicine

## 2019-06-03 DIAGNOSIS — I10 Essential (primary) hypertension: Secondary | ICD-10-CM

## 2019-06-03 MED ORDER — AMLODIPINE BESYLATE 5 MG PO TABS: 5.0000 mg | ORAL_TABLET | Freq: Every day | ORAL | 3 refills | Status: DC

## 2019-06-05 ENCOUNTER — Encounter: Payer: Self-pay | Admitting: Internal Medicine

## 2019-06-05 ENCOUNTER — Telehealth: Payer: Self-pay | Admitting: Internal Medicine

## 2019-06-05 NOTE — Telephone Encounter (Signed)
[facility] never call pt about 01/2019 left breast mammo and [place] I ordered  Please schedule this for patient H/o left breast abscess/cyst ~ 01/2019 this is recurrent and had in 2016 as well  mammo [facility] 8/7/200 care everywhere negative    Thanks tMS

## 2019-06-05 NOTE — Patient Instructions (Addendum)
Read the info below  Your goal blood pressure is <130/<80  -let me know what your blood pressure is In the next 2 weeks via my chart please Purchase an upper arm automatic blood pressure cuff with good reviews from [facility] for your arm size likely large or extra large ask Langley Gauss which size is best they are about $25-$35 on amazone    Please be fasting for labs at [facility] 8-12 hours nothing but water and your medications  Your lab orders are ready for pick up at the front desk  Take care  [place] High Cholesterol  High cholesterol is a condition in which the blood has high levels of a white, waxy, fat-like substance (cholesterol). The human body needs small amounts of cholesterol. The liver makes all the cholesterol that the body needs. Extra (excess) cholesterol comes from the food that we eat. Cholesterol is carried from the liver by the blood through the blood vessels. If you have high cholesterol, deposits (plaques) may build up on the walls of your blood vessels (arteries). Plaques make the arteries narrower and stiffer. Cholesterol plaques increase your risk for heart attack and stroke. Work with your health care provider to keep your cholesterol levels in a healthy range. What increases the risk? This condition is more likely to develop in people who:  Eat foods that are high in animal fat (saturated fat) or cholesterol.  Are overweight.  Are not getting enough exercise.  Have a family history of high cholesterol. What are the signs or symptoms? There are no symptoms of this condition. How is this diagnosed? This condition may be diagnosed from the results of a blood test.  If you are older than age 62, your health care provider may check your cholesterol every 4-6 years.  You may be checked more often if you already have high cholesterol or other risk factors for heart disease. The blood test for cholesterol measures:  "Bad" cholesterol (LDL cholesterol). This is the main type of  cholesterol that causes heart disease. The desired level for LDL is less than 100.  "Good" cholesterol (HDL cholesterol). This type helps to protect against heart disease by cleaning the arteries and carrying the LDL away. The desired level for HDL is 60 or higher.  Triglycerides. These are fats that the body can store or burn for energy. The desired number for triglycerides is lower than 150.  Total cholesterol. This is a measure of the total amount of cholesterol in your blood, including LDL cholesterol, HDL cholesterol, and triglycerides. A healthy number is less than 200. How is this treated? This condition is treated with diet changes, lifestyle changes, and medicines. Diet changes  This may include eating more whole grains, fruits, vegetables, nuts, and fish.  This may also include cutting back on red meat and foods that have a lot of added sugar. Lifestyle changes  Changes may include getting at least 40 minutes of aerobic exercise 3 times a week. Aerobic exercises include walking, biking, and swimming. Aerobic exercise along with a healthy diet can help you maintain a healthy weight.  Changes may also include quitting smoking. Medicines  Medicines are usually given if diet and lifestyle changes have failed to reduce your cholesterol to healthy levels.  Your health care provider may prescribe a statin medicine. Statin medicines have been shown to reduce cholesterol, which can reduce the risk of heart disease. Follow these instructions at home: Eating and drinking If told by your health care provider:  Eat chicken (without  skin), fish, veal, shellfish, ground [place] breast, and round or loin cuts of red meat.  Do not eat fried foods or fatty meats, such as hot dogs and salami.  Eat plenty of fruits, such as apples.  Eat plenty of vegetables, such as broccoli, potatoes, and carrots.  Eat beans, peas, and lentils.  Eat grains such as barley, rice, couscous, and bulgur  wheat.  Eat pasta without cream sauces.  Use skim or nonfat milk, and eat low-fat or nonfat yogurt and cheeses.  Do not eat or drink whole milk, cream, ice cream, egg yolks, or hard cheeses.  Do not eat stick margarine or tub margarines that contain trans fats (also called partially hydrogenated oils).  Do not eat saturated tropical oils, such as coconut oil and palm oil.  Do not eat cakes, cookies, crackers, or other baked goods that contain trans fats.  General instructions  Exercise as directed by your health care provider. Increase your activity level with activities such as gardening, walking, and taking the stairs.  Take over-the-counter and prescription medicines only as told by your health care provider.  Do not use any products that contain nicotine or tobacco, such as cigarettes and e-cigarettes. If you need help quitting, ask your health care provider.  Keep all follow-up visits as told by your health care provider. This is important. Contact a health care provider if:  You are struggling to maintain a healthy diet or weight.  You need help to start on an exercise program.  You need help to stop smoking. Get help right away if:  You have chest pain.  You have trouble breathing. This information is not intended to replace advice given to you by your health care provider. Make sure you discuss any questions you have with your health care provider. Document Revised: 05/08/2017 Document Reviewed: 11/03/2015 Elsevier Patient Education  [place].  Cholesterol Content in Foods Cholesterol is a waxy, fat-like substance that helps to carry fat in the blood. The body needs cholesterol in small amounts, but too much cholesterol can cause damage to the arteries and heart. Most people should eat less than 200 milligrams (mg) of cholesterol a day. Foods with cholesterol  Cholesterol is found in animal-based foods, such as meat, seafood, and dairy. Generally, low-fat  dairy and lean meats have less cholesterol than full-fat dairy and fatty meats. The milligrams of cholesterol per serving (mg per serving) of common cholesterol-containing foods are listed below. Meat and other proteins  Egg -- one large whole egg has 186 mg.  Veal shank -- 4 oz has 141 mg.  Lean ground [place] (93% lean) -- 4 oz has 118 mg.  Fat-trimmed lamb loin -- 4 oz has 106 mg.  Lean ground beef (90% lean) -- 4 oz has 100 mg.  Lobster -- 3.5 oz has 90 mg.  Pork loin chops -- 4 oz has 86 mg.  Canned salmon -- 3.5 oz has 83 mg.  Fat-trimmed beef top loin -- 4 oz has 78 mg.  Frankfurter -- 1 frank (3.5 oz) has 77 mg.  Crab -- 3.5 oz has 71 mg.  Roasted chicken without skin, white meat -- 4 oz has 66 mg.  Light bologna -- 2 oz has 45 mg.  Deli-cut [place] -- 2 oz has 31 mg.  Canned tuna -- 3.5 oz has 31 mg.  Berniece Salines -- 1 oz has 29 mg.  Oysters and mussels (raw) -- 3.5 oz has 25 mg.  Mackerel -- 1 oz has 22  mg.  Trout -- 1 oz has 20 mg.  Pork sausage -- 1 link (1 oz) has 17 mg.  Salmon -- 1 oz has 16 mg.  Tilapia -- 1 oz has 14 mg. Dairy  Soft-serve ice cream --  cup (4 oz) has 103 mg.  Whole-milk yogurt -- 1 cup (8 oz) has 29 mg.  Cheddar cheese -- 1 oz has 28 mg.  American cheese -- 1 oz has 28 mg.  Whole milk -- 1 cup (8 oz) has 23 mg.  2% milk -- 1 cup (8 oz) has 18 mg.  Cream cheese -- 1 tablespoon (Tbsp) has 15 mg.  Cottage cheese --  cup (4 oz) has 14 mg.  Low-fat (1%) milk -- 1 cup (8 oz) has 10 mg.  Sour cream -- 1 Tbsp has 8.5 mg.  Low-fat yogurt -- 1 cup (8 oz) has 8 mg.  Nonfat Greek yogurt -- 1 cup (8 oz) has 7 mg.  Half-and-half cream -- 1 Tbsp has 5 mg. Fats and oils  Cod liver oil -- 1 tablespoon (Tbsp) has 82 mg.  Butter -- 1 Tbsp has 15 mg.  Lard -- 1 Tbsp has 14 mg.  Bacon grease -- 1 Tbsp has 14 mg.  Mayonnaise -- 1 Tbsp has 5-10 mg.  Margarine -- 1 Tbsp has 3-10 mg. Exact amounts of cholesterol in these foods  may vary depending on specific ingredients and brands. Foods without cholesterol Most plant-based foods do not have cholesterol unless you combine them with a food that has cholesterol. Foods without cholesterol include:  Grains and cereals.  Vegetables.  Fruits.  Vegetable oils, such as olive, canola, and sunflower oil.  Legumes, such as peas, beans, and lentils.  Nuts and seeds.  Egg whites. Summary  The body needs cholesterol in small amounts, but too much cholesterol can cause damage to the arteries and heart.  Most people should eat less than 200 milligrams (mg) of cholesterol a day. This information is not intended to replace advice given to you by your health care provider. Make sure you discuss any questions you have with your health care provider. Document Revised: 04/17/2017 Document Reviewed: 12/30/2016 Elsevier Patient Education  [place].  Low-Sodium Eating Plan Sodium, which is an element that makes up salt, helps you maintain a healthy balance of fluids in your body. Too much sodium can increase your blood pressure and cause fluid and waste to be held in your body. Your health care provider or dietitian may recommend following this plan if you have high blood pressure (hypertension), kidney disease, liver disease, or heart failure. Eating less sodium can help lower your blood pressure, reduce swelling, and protect your heart, liver, and kidneys. What are tips for following this plan? General guidelines  Most people on this plan should limit their sodium intake to 1,500-2,000 mg (milligrams) of sodium each day. Reading food labels   The Nutrition Facts label lists the amount of sodium in one serving of the food. If you eat more than one serving, you must multiply the listed amount of sodium by the number of servings.  Choose foods with less than 140 mg of sodium per serving.  Avoid foods with 300 mg of sodium or more per serving. Shopping  Look for  lower-sodium products, often labeled as "low-sodium" or "no salt added."  Always check the sodium content even if foods are labeled as "unsalted" or "no salt added".  Buy fresh foods. ? Avoid canned foods and premade or  frozen meals. ? Avoid canned, cured, or processed meats  Buy breads that have less than 80 mg of sodium per slice. Cooking  Eat more home-cooked food and less restaurant, buffet, and fast food.  Avoid adding salt when cooking. Use salt-free seasonings or herbs instead of table salt or sea salt. Check with your health care provider or pharmacist before using salt substitutes.  Cook with plant-based oils, such as canola, sunflower, or olive oil. Meal planning  When eating at a restaurant, ask that your food be prepared with less salt or no salt, if possible.  Avoid foods that contain MSG (monosodium glutamate). MSG is sometimes added to [place] food, bouillon, and some canned foods. What foods are recommended? The items listed may not be a complete list. Talk with your dietitian about what dietary choices are best for you. Grains Low-sodium cereals, including oats, puffed wheat and rice, and shredded wheat. Low-sodium crackers. Unsalted rice. Unsalted pasta. Low-sodium bread. Whole-grain breads and whole-grain pasta. Vegetables Fresh or frozen vegetables. "No salt added" canned vegetables. "No salt added" tomato sauce and paste. Low-sodium or reduced-sodium tomato and vegetable juice. Fruits Fresh, frozen, or canned fruit. Fruit juice. Meats and other protein foods Fresh or frozen (no salt added) meat, poultry, seafood, and fish. Low-sodium canned tuna and salmon. Unsalted nuts. Dried peas, beans, and lentils without added salt. Unsalted canned beans. Eggs. Unsalted nut butters. Dairy Milk. Soy milk. Cheese that is naturally low in sodium, such as ricotta cheese, fresh mozzarella, or Swiss cheese Low-sodium or reduced-sodium cheese. Cream cheese. Yogurt. Fats and  oils Unsalted butter. Unsalted margarine with no trans fat. Vegetable oils such as canola or olive oils. Seasonings and other foods Fresh and dried herbs and spices. Salt-free seasonings. Low-sodium mustard and ketchup. Sodium-free salad dressing. Sodium-free light mayonnaise. Fresh or refrigerated horseradish. Lemon juice. Vinegar. Homemade, reduced-sodium, or low-sodium soups. Unsalted popcorn and pretzels. Low-salt or salt-free chips. What foods are not recommended? The items listed may not be a complete list. Talk with your dietitian about what dietary choices are best for you. Grains Instant hot cereals. Bread stuffing, pancake, and biscuit mixes. Croutons. Seasoned rice or pasta mixes. Noodle soup cups. Boxed or frozen macaroni and cheese. Regular salted crackers. Self-rising flour. Vegetables Sauerkraut, pickled vegetables, and relishes. Olives. [place] fries. Onion rings. Regular canned vegetables (not low-sodium or reduced-sodium). Regular canned tomato sauce and paste (not low-sodium or reduced-sodium). Regular tomato and vegetable juice (not low-sodium or reduced-sodium). Frozen vegetables in sauces. Meats and other protein foods Meat or fish that is salted, canned, smoked, spiced, or pickled. Bacon, ham, sausage, hotdogs, corned beef, chipped beef, packaged lunch meats, salt pork, jerky, pickled herring, anchovies, regular canned tuna, sardines, salted nuts. Dairy Processed cheese and cheese spreads. Cheese curds. Blue cheese. Feta cheese. String cheese. Regular cottage cheese. Buttermilk. Canned milk. Fats and oils Salted butter. Regular margarine. Ghee. Bacon fat. Seasonings and other foods Onion salt, garlic salt, seasoned salt, table salt, and sea salt. Canned and packaged gravies. Worcestershire sauce. Tartar sauce. Barbecue sauce. Teriyaki sauce. Soy sauce, including reduced-sodium. Steak sauce. Fish sauce. Oyster sauce. Cocktail sauce. Horseradish that you find on the shelf.  Regular ketchup and mustard. Meat flavorings and tenderizers. Bouillon cubes. Hot sauce and Tabasco sauce. Premade or packaged marinades. Premade or packaged taco seasonings. Relishes. Regular salad dressings. Salsa. Potato and tortilla chips. Corn chips and puffs. Salted popcorn and pretzels. Canned or dried soups. Pizza. Frozen entrees and pot pies. Summary  Eating less sodium can help lower  your blood pressure, reduce swelling, and protect your heart, liver, and kidneys.  Most people on this plan should limit their sodium intake to 1,500-2,000 mg (milligrams) of sodium each day.  Canned, boxed, and frozen foods are high in sodium. Restaurant foods, fast foods, and pizza are also very high in sodium. You also get sodium by adding salt to food.  Try to cook at home, eat more fresh fruits and vegetables, and eat less fast food, canned, processed, or prepared foods. This information is not intended to replace advice given to you by your health care provider. Make sure you discuss any questions you have with your health care provider. Document Revised: 04/17/2017 Document Reviewed: 04/28/2016 Elsevier Patient Education  [place] DASH stands for "Dietary Approaches to Stop Hypertension." The DASH eating plan is a healthy eating plan that has been shown to reduce high blood pressure (hypertension). It may also reduce your risk for type 2 diabetes, heart disease, and stroke. The DASH eating plan may also help with weight loss. What are tips for following this plan?  General guidelines  Avoid eating more than 2,300 mg (milligrams) of salt (sodium) a day. If you have hypertension, you may need to reduce your sodium intake to 1,500 mg a day.  Limit alcohol intake to no more than 1 drink a day for nonpregnant women and 2 drinks a day for men. One drink equals 12 oz of beer, 5 oz of wine, or 1 oz of hard liquor.  Work with your health care provider to maintain a healthy  body weight or to lose weight. Ask what an ideal weight is for you.  Get at least 30 minutes of exercise that causes your heart to beat faster (aerobic exercise) most days of the week. Activities may include walking, swimming, or biking.  Work with your health care provider or diet and nutrition specialist (dietitian) to adjust your eating plan to your individual calorie needs. Reading food labels   Check food labels for the amount of sodium per serving. Choose foods with less than 5 percent of the Daily Value of sodium. Generally, foods with less than 300 mg of sodium per serving fit into this eating plan.  To find whole grains, look for the word "whole" as the first word in the ingredient list. Shopping  Buy products labeled as "low-sodium" or "no salt added."  Buy fresh foods. Avoid canned foods and premade or frozen meals. Cooking  Avoid adding salt when cooking. Use salt-free seasonings or herbs instead of table salt or sea salt. Check with your health care provider or pharmacist before using salt substitutes.  Do not fry foods. Cook foods using healthy methods such as baking, boiling, grilling, and broiling instead.  Cook with heart-healthy oils, such as olive, canola, soybean, or sunflower oil. Meal planning  Eat a balanced diet that includes: ? 5 or more servings of fruits and vegetables each day. At each meal, try to fill half of your plate with fruits and vegetables. ? Up to 6-8 servings of whole grains each day. ? Less than 6 oz of lean meat, poultry, or fish each day. A 3-oz serving of meat is about the same size as a deck of cards. One egg equals 1 oz. ? 2 servings of low-fat dairy each day. ? A serving of nuts, seeds, or beans 5 times each week. ? Heart-healthy fats. Healthy fats called Omega-3 fatty acids are found in foods such as flaxseeds and [place]  fish, like sardines, salmon, and mackerel.  Limit how much you eat of the following: ? Canned or prepackaged  foods. ? Food that is high in trans fat, such as fried foods. ? Food that is high in saturated fat, such as fatty meat. ? Sweets, desserts, sugary drinks, and other foods with added sugar. ? Full-fat dairy products.  Do not salt foods before eating.  Try to eat at least 2 vegetarian meals each week.  Eat more home-cooked food and less restaurant, buffet, and fast food.  When eating at a restaurant, ask that your food be prepared with less salt or no salt, if possible. What foods are recommended? The items listed may not be a complete list. Talk with your dietitian about what dietary choices are best for you. Grains Whole-grain or whole-wheat bread. Whole-grain or whole-wheat pasta. Brown rice. [place]. Bulgur. Whole-grain and low-sodium cereals. Pita bread. Low-fat, low-sodium crackers. Whole-wheat flour tortillas. Vegetables Fresh or frozen vegetables (raw, steamed, roasted, or grilled). Low-sodium or reduced-sodium tomato and vegetable juice. Low-sodium or reduced-sodium tomato sauce and tomato paste. Low-sodium or reduced-sodium canned vegetables. Fruits All fresh, dried, or frozen fruit. Canned fruit in natural juice (without added sugar). Meat and other protein foods Skinless chicken or [place]. Ground chicken or [place]. Pork with fat trimmed off. Fish and seafood. Egg whites. Dried beans, peas, or lentils. Unsalted nuts, nut butters, and seeds. Unsalted canned beans. Lean cuts of beef with fat trimmed off. Low-sodium, lean deli meat. Dairy Low-fat (1%) or fat-free (skim) milk. Fat-free, low-fat, or reduced-fat cheeses. Nonfat, low-sodium ricotta or cottage cheese. Low-fat or nonfat yogurt. Low-fat, low-sodium cheese. Fats and oils Soft margarine without trans fats. Vegetable oil. Low-fat, reduced-fat, or light mayonnaise and salad dressings (reduced-sodium). Canola, safflower, olive, soybean, and sunflower oils. Avocado. Seasoning and other foods Herbs. Spices. Seasoning  mixes without salt. Unsalted popcorn and pretzels. Fat-free sweets. What foods are not recommended? The items listed may not be a complete list. Talk with your dietitian about what dietary choices are best for you. Grains Baked goods made with fat, such as croissants, muffins, or some breads. Dry pasta or rice meal packs. Vegetables Creamed or fried vegetables. Vegetables in a cheese sauce. Regular canned vegetables (not low-sodium or reduced-sodium). Regular canned tomato sauce and paste (not low-sodium or reduced-sodium). Regular tomato and vegetable juice (not low-sodium or reduced-sodium). Angie Fava. Olives. Fruits Canned fruit in a light or heavy syrup. Fried fruit. Fruit in cream or butter sauce. Meat and other protein foods Fatty cuts of meat. Ribs. Fried meat. [place] Salines. Sausage. Bologna and other processed lunch meats. Salami. Fatback. Hotdogs. Bratwurst. Salted nuts and seeds. Canned beans with added salt. Canned or smoked fish. Whole eggs or egg yolks. Chicken or [place] with skin. Dairy Whole or 2% milk, cream, and half-and-half. Whole or full-fat cream cheese. Whole-fat or sweetened yogurt. Full-fat cheese. Nondairy creamers. Whipped toppings. Processed cheese and cheese spreads. Fats and oils Butter. Stick margarine. Lard. Shortening. Ghee. Bacon fat. Tropical oils, such as coconut, palm kernel, or palm oil. Seasoning and other foods Salted popcorn and pretzels. Onion salt, garlic salt, seasoned salt, table salt, and sea salt. Worcestershire sauce. Tartar sauce. Barbecue sauce. Teriyaki sauce. Soy sauce, including reduced-sodium. Steak sauce. Canned and packaged gravies. Fish sauce. Oyster sauce. Cocktail sauce. Horseradish that you find on the shelf. Ketchup. Mustard. Meat flavorings and tenderizers. Bouillon cubes. Hot sauce and Tabasco sauce. Premade or packaged marinades. Premade or packaged taco seasonings. Relishes. Regular salad dressings. Where to find more  information:  National  Heart, Lung, and Blood Institute: https://wilson-eaton.com/  American Heart Association: www.heart.org Summary  The DASH eating plan is a healthy eating plan that has been shown to reduce high blood pressure (hypertension). It may also reduce your risk for type 2 diabetes, heart disease, and stroke.  With the DASH eating plan, you should limit salt (sodium) intake to 2,300 mg a day. If you have hypertension, you may need to reduce your sodium intake to 1,500 mg a day.  When on the DASH eating plan, aim to eat more fresh fruits and vegetables, whole grains, lean proteins, low-fat dairy, and heart-healthy fats.  Work with your health care provider or diet and nutrition specialist (dietitian) to adjust your eating plan to your individual calorie needs. This information is not intended to replace advice given to you by your health care provider. Make sure you discuss any questions you have with your health care provider. Document Revised: 04/17/2017 Document Reviewed: 04/28/2016 Elsevier Patient Education  2020 [employer].

## 2019-06-06 ENCOUNTER — Telehealth: Payer: Self-pay | Admitting: *Deleted

## 2019-06-06 NOTE — Telephone Encounter (Signed)
-----   Message from Delorise Jackson, MD sent at 06/05/2019  8:41 PM EST ----- Pt to pick up copy of labs sent to printer Please call pt Monday 06/06/19 and leave at the front desk to have labs [facility] and be fasting 8-12 hours nothing but water and medications ThanksTMS

## 2019-06-06 NOTE — Telephone Encounter (Signed)
Per the notes from [place] they called her three times to schedule, once her vm was full and left two vm's for her to rtc. I have sent her a mychart message since she is active on mychart with their phone number so she can call to schedule this.  Thanks [employer]

## 2019-06-20 IMAGING — DX DG KNEE COMPLETE 4+V*L*
4 series · 4 of 4 positions shown · non-contrast
Comparison: None.

CLINICAL DATA: Left knee pain

EXAM:
LEFT KNEE - COMPLETE 4+ VIEW

[knee standing ap]
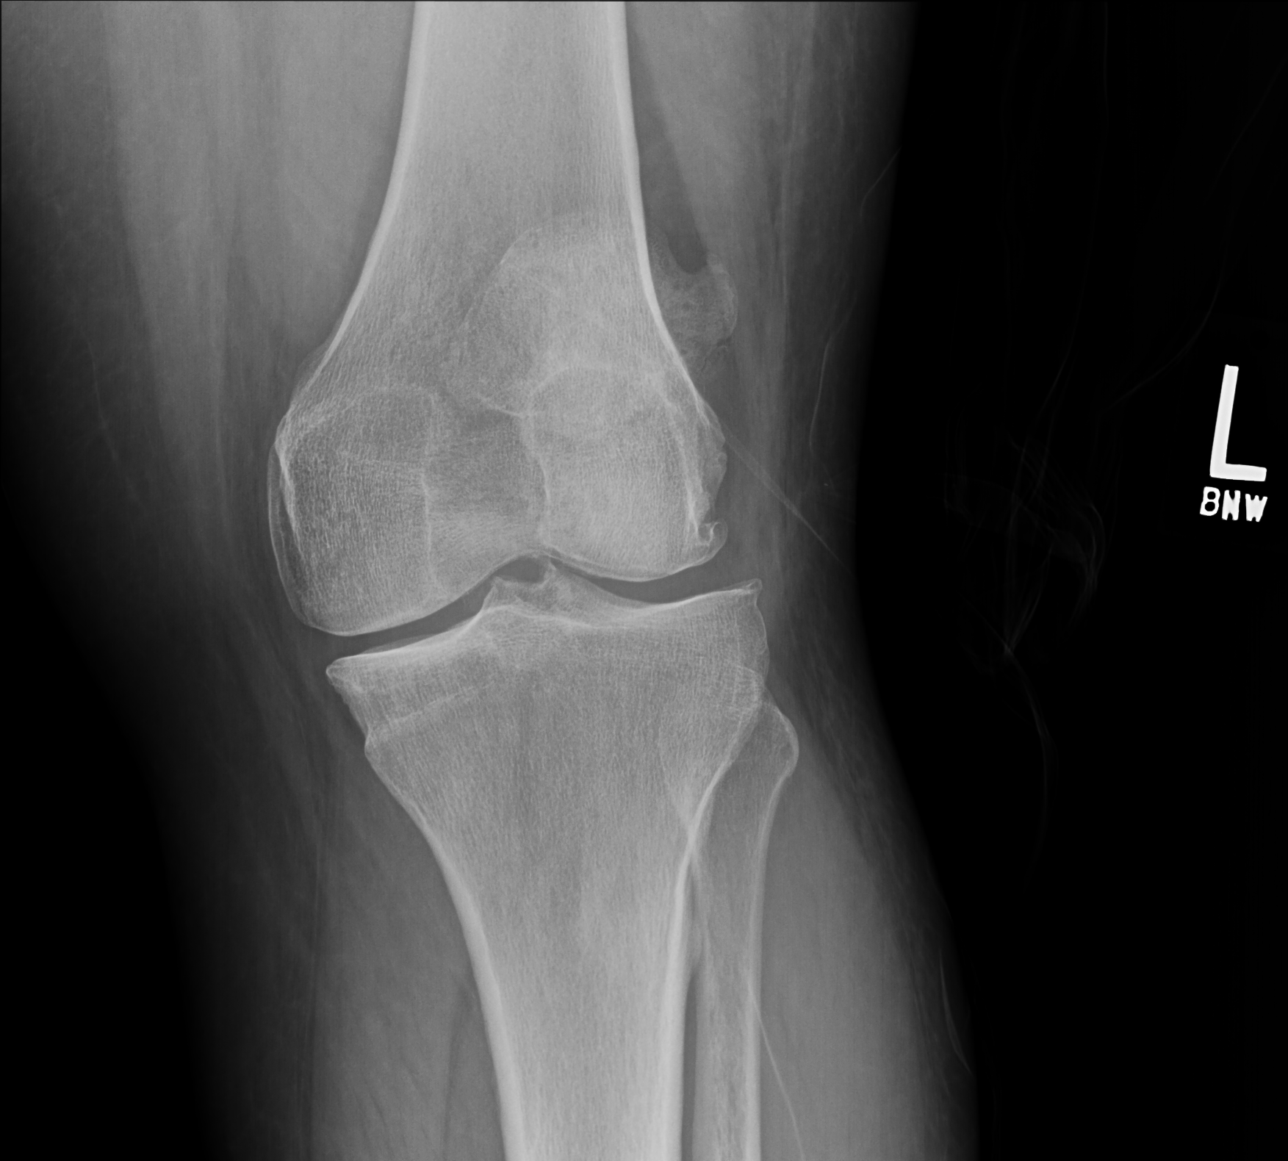

[knee standing external ap]
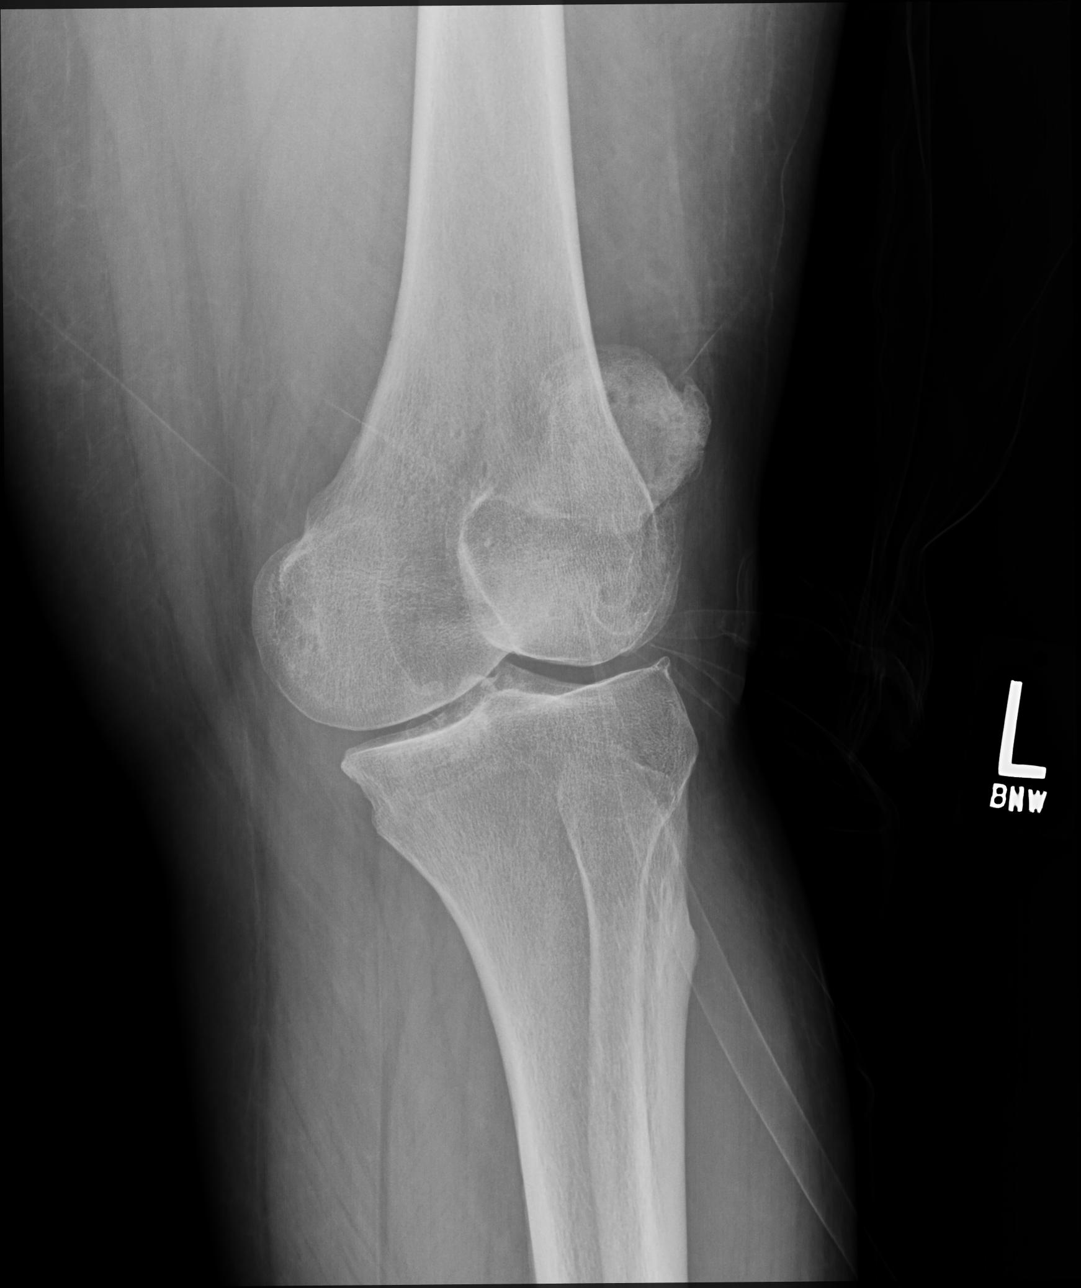

[knee standing internal ap]
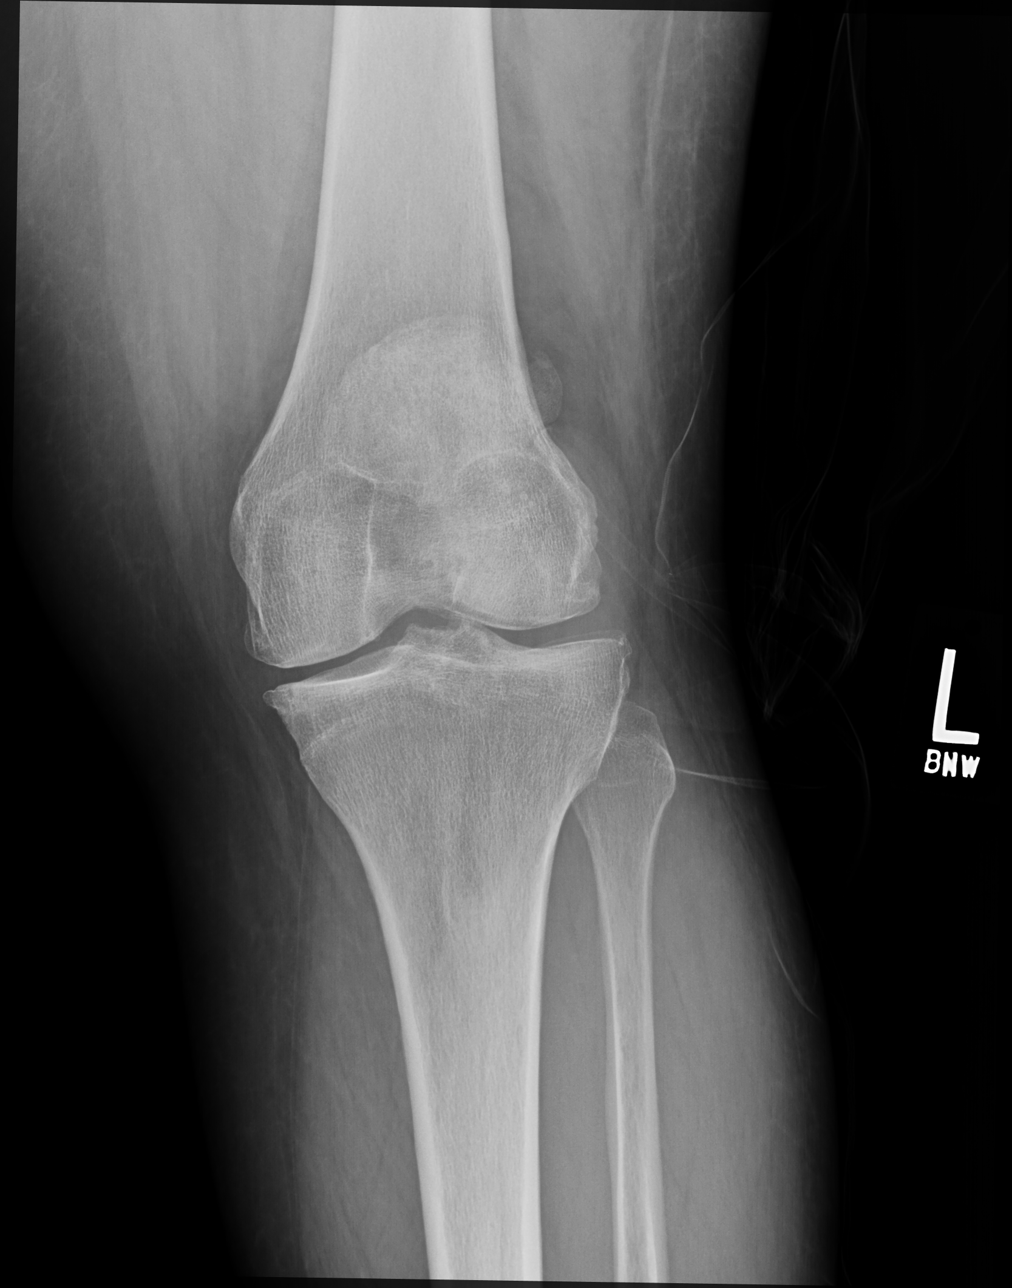

[knee standing lat]
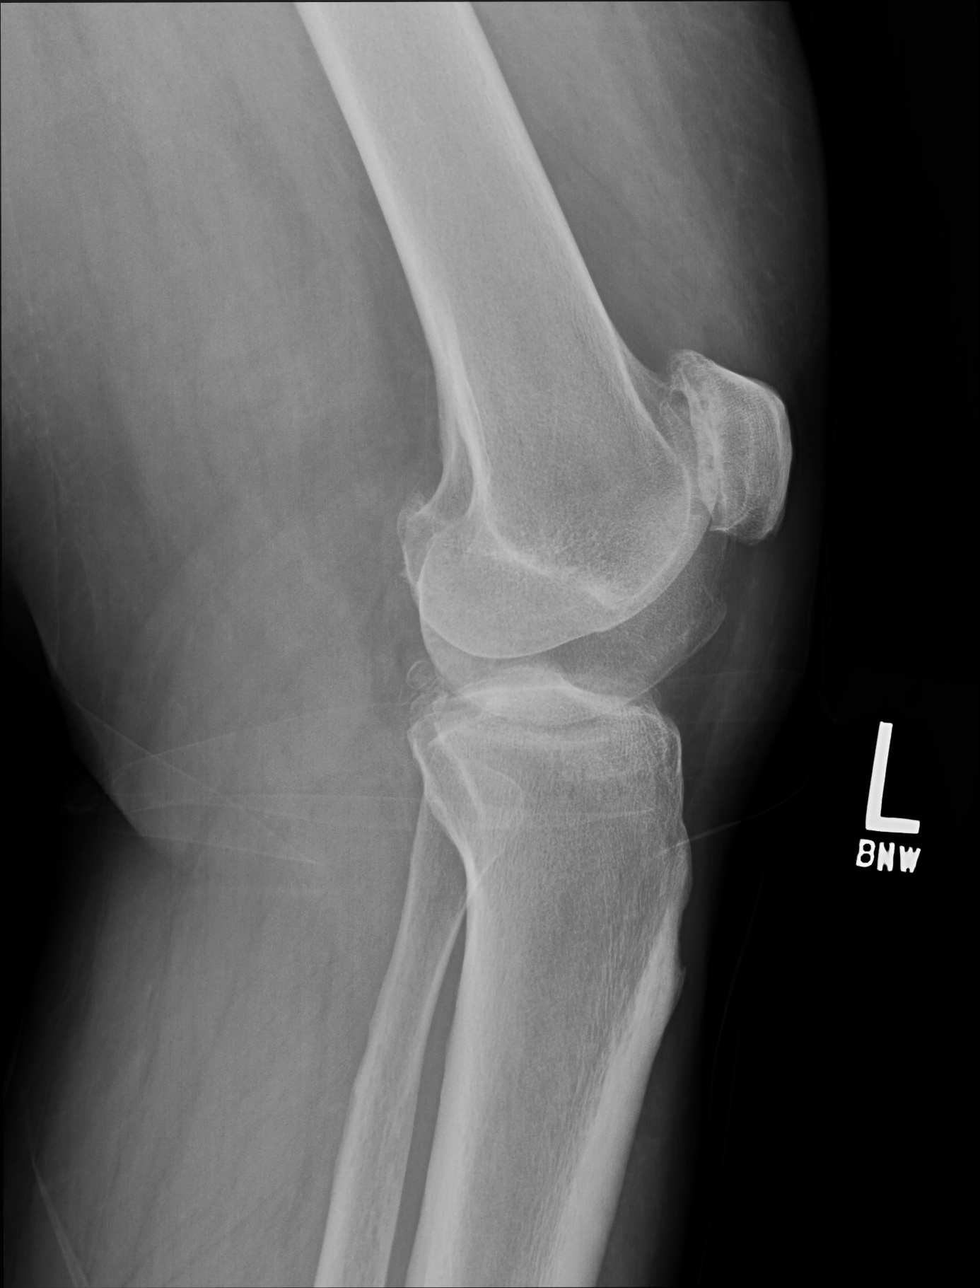

[4 of 4 positions shown; findings below may reference images not displayed]

FINDINGS: Tricompartment degenerative changes, most pronounced in the
patellofemoral compartment with joint space narrowing and spurring.
No acute bony abnormality. Specifically, no fracture, subluxation,
or dislocation. No joint effusion.
IMPRESSION: Moderate tricompartment degenerative changes, most pronounced in the
patellofemoral compartment. No acute bony abnormality.

## 2019-06-28 ENCOUNTER — Telehealth: Payer: Self-pay | Admitting: Internal Medicine

## 2019-06-28 NOTE — Telephone Encounter (Signed)
Patient needs written lab orders so she can have them done at her work. Let patient know when she can pick lab orders.

## 2019-06-28 NOTE — Telephone Encounter (Signed)
Pt called to see if pt's orders were ready to be picked up. I let her know they are up front ready to be picked up.

## 2019-06-28 NOTE — Telephone Encounter (Signed)
Patient has been informed.

## 2019-06-28 NOTE — Telephone Encounter (Signed)
Orders on printer   Call pt to pick   up orders   Sally Harmon

## 2019-06-30 ENCOUNTER — Other Ambulatory Visit: Payer: Self-pay | Admitting: Internal Medicine

## 2019-07-01 LAB — IRON AND TIBC
Iron Saturation: 6 % — CL (ref 15–55)
Iron: 21 ug/dL — ABNORMAL LOW (ref 27–159)
Total Iron Binding Capacity: 345 ug/dL (ref 250–450)
UIBC: 324 ug/dL (ref 131–425)

## 2019-07-01 LAB — COMPREHENSIVE METABOLIC PANEL
ALT: 14 IU/L (ref 0–32)
AST: 13 IU/L (ref 0–40)
Albumin/Globulin Ratio: 1.1 — ABNORMAL LOW (ref 1.2–2.2)
Albumin: 3.9 g/dL (ref 3.8–4.8)
Alkaline Phosphatase: 83 IU/L (ref 39–117)
BUN/Creatinine Ratio: 14 (ref 9–23)
BUN: 12 mg/dL (ref 6–24)
Bilirubin Total: 0.2 mg/dL (ref 0.0–1.2)
CO2: 26 mmol/L (ref 20–29)
Calcium: 9.1 mg/dL (ref 8.7–10.2)
Chloride: 95 mmol/L — ABNORMAL LOW (ref 96–106)
Creatinine, Ser: 0.88 mg/dL (ref 0.57–1.00)
GFR calc Af Amer: 89 mL/min/{1.73_m2} (ref >59)
GFR calc non Af Amer: 77 mL/min/{1.73_m2} (ref >59)
Globulin, Total: 3.4 g/dL (ref 1.5–4.5)
Glucose: 94 mg/dL (ref 65–99)
Potassium: 3.9 mmol/L (ref 3.5–5.2)
Sodium: 136 mmol/L (ref 134–144)
Total Protein: 7.3 g/dL (ref 6.0–8.5)

## 2019-07-01 LAB — URINALYSIS, ROUTINE W REFLEX MICROSCOPIC
Bilirubin, UA: NEGATIVE
Glucose, UA: NEGATIVE
Ketones, UA: NEGATIVE
Nitrite, UA: NEGATIVE
Protein,UA: NEGATIVE
RBC, UA: NEGATIVE
Specific Gravity, UA: 1.008 (ref 1.005–1.030)
Urobilinogen, Ur: 0.2 mg/dL (ref 0.2–1.0)
pH, UA: 6.5 (ref 5.0–7.5)

## 2019-07-01 LAB — CBC WITH DIFFERENTIAL/PLATELET
Basophils Absolute: 0.1 10*3/uL (ref 0.0–0.2)
Basos: 1 %
EOS (ABSOLUTE): 0.2 10*3/uL (ref 0.0–0.4)
Eos: 2 %
Hematocrit: 37.3 % (ref 34.0–46.6)
Hemoglobin: 11.8 g/dL (ref 11.1–15.9)
Immature Grans (Abs): 0 10*3/uL (ref 0.0–0.1)
Immature Granulocytes: 0 %
Lymphocytes Absolute: 2.2 10*3/uL (ref 0.7–3.1)
Lymphs: 24 %
MCH: 23.9 pg — ABNORMAL LOW (ref 26.6–33.0)
MCHC: 31.6 g/dL (ref 31.5–35.7)
MCV: 76 fL — ABNORMAL LOW (ref 79–97)
Monocytes Absolute: 0.7 10*3/uL (ref 0.1–0.9)
Monocytes: 8 %
Neutrophils Absolute: 5.9 10*3/uL (ref 1.4–7.0)
Neutrophils: 65 %
Platelets: 418 10*3/uL (ref 150–450)
RBC: 4.94 x10E6/uL (ref 3.77–5.28)
RDW: 17.4 % — ABNORMAL HIGH (ref 11.7–15.4)
WBC: 9.1 10*3/uL (ref 3.4–10.8)

## 2019-07-01 LAB — LIPID PANEL W/O CHOL/HDL RATIO
Cholesterol, Total: 207 mg/dL — ABNORMAL HIGH (ref 100–199)
HDL: 37 mg/dL — ABNORMAL LOW (ref >39)
LDL Chol Calc (NIH): 144 mg/dL — ABNORMAL HIGH (ref 0–99)
Triglycerides: 144 mg/dL (ref 0–149)
VLDL Cholesterol Cal: 26 mg/dL (ref 5–40)

## 2019-07-01 LAB — MICROSCOPIC EXAMINATION: Casts: NONE SEEN /lpf

## 2019-07-01 LAB — TSH: TSH: 1.27 u[IU]/mL (ref 0.450–4.500)

## 2019-07-01 LAB — VITAMIN D 25 HYDROXY (VIT D DEFICIENCY, FRACTURES): Vit D, 25-Hydroxy: 60 ng/mL (ref 30.0–100.0)

## 2019-07-01 LAB — FERRITIN: Ferritin: 13 ng/mL — ABNORMAL LOW (ref 15–150)

## 2019-07-07 ENCOUNTER — Other Ambulatory Visit: Payer: Self-pay | Admitting: Internal Medicine

## 2019-07-07 DIAGNOSIS — J309 Allergic rhinitis, unspecified: Secondary | ICD-10-CM

## 2019-07-07 MED ORDER — MONTELUKAST SODIUM 10 MG PO TABS: 10.0000 mg | ORAL_TABLET | Freq: Every day | ORAL | 3 refills | Status: DC

## 2019-07-29 ENCOUNTER — Other Ambulatory Visit: Payer: Self-pay | Admitting: Internal Medicine

## 2019-07-29 DIAGNOSIS — E559 Vitamin D deficiency, unspecified: Secondary | ICD-10-CM

## 2019-07-29 MED ORDER — CHOLECALCIFEROL 1.25 MG (50000 UT) PO CAPS: 50000.0000 [IU] | ORAL_CAPSULE | ORAL | 1 refills | Status: DC

## 2019-09-07 ENCOUNTER — Other Ambulatory Visit: Payer: Self-pay

## 2019-09-07 ENCOUNTER — Ambulatory Visit (INDEPENDENT_AMBULATORY_CARE_PROVIDER_SITE_OTHER): Payer: BC Managed Care – PPO | Admitting: Internal Medicine

## 2019-09-07 ENCOUNTER — Encounter: Payer: Self-pay | Admitting: Internal Medicine

## 2019-09-07 VITALS — BP 117/74 | HR 88 | Temp 97.4°F | Ht 63.0 in | Wt 281.0 lb

## 2019-09-07 DIAGNOSIS — I10 Essential (primary) hypertension: Secondary | ICD-10-CM | POA: Diagnosis not present

## 2019-09-07 DIAGNOSIS — J309 Allergic rhinitis, unspecified: Secondary | ICD-10-CM

## 2019-09-07 DIAGNOSIS — K219 Gastro-esophageal reflux disease without esophagitis: Secondary | ICD-10-CM | POA: Diagnosis not present

## 2019-09-07 DIAGNOSIS — R87618 Other abnormal cytological findings on specimens from cervix uteri: Secondary | ICD-10-CM

## 2019-09-07 DIAGNOSIS — L219 Seborrheic dermatitis, unspecified: Secondary | ICD-10-CM

## 2019-09-07 DIAGNOSIS — E611 Iron deficiency: Secondary | ICD-10-CM | POA: Insufficient documentation

## 2019-09-07 DIAGNOSIS — E785 Hyperlipidemia, unspecified: Secondary | ICD-10-CM

## 2019-09-07 MED ORDER — AMLODIPINE BESYLATE 5 MG PO TABS: 5.0000 mg | ORAL_TABLET | Freq: Every day | ORAL | 3 refills | Status: DC

## 2019-09-07 MED ORDER — KETOCONAZOLE 2 % EX SHAM: 1.0000 "application " | MEDICATED_SHAMPOO | CUTANEOUS | 11 refills | Status: DC

## 2019-09-07 MED ORDER — FAMOTIDINE 20 MG PO TABS: 20.0000 mg | ORAL_TABLET | Freq: Two times a day (BID) | ORAL | 3 refills | Status: DC

## 2019-09-07 MED ORDER — LISINOPRIL-HYDROCHLOROTHIAZIDE 20-12.5 MG PO TABS: 2.0000 | ORAL_TABLET | Freq: Every day | ORAL | 1 refills | Status: DC

## 2019-09-07 MED ORDER — HYDROCORTISONE 2.5 % EX LOTN: TOPICAL_LOTION | Freq: Two times a day (BID) | CUTANEOUS | 11 refills | Status: DC

## 2019-09-07 MED ORDER — ATORVASTATIN CALCIUM 10 MG PO TABS: 10.0000 mg | ORAL_TABLET | Freq: Every day | ORAL | 3 refills | Status: DC

## 2019-09-07 MED ORDER — MONTELUKAST SODIUM 10 MG PO TABS: 10.0000 mg | ORAL_TABLET | Freq: Every day | ORAL | 3 refills | Status: DC

## 2019-09-07 MED ORDER — CETIRIZINE HCL 10 MG PO TABS: 10.0000 mg | ORAL_TABLET | Freq: Every day | ORAL | 3 refills | Status: DC

## 2019-09-07 NOTE — Addendum Note (Signed)
Addended by: Orland Mustard on: 09/07/2019 05:57 PM   Modules accepted: Orders

## 2019-09-07 NOTE — Patient Instructions (Addendum)
Stop dial and use cetaphil or cerave face wash and moisturizer/lotion   Nature made/centrum multivitamin with iron daily (325 mg iron=ferrous sulfate daily) with vitamin C iron is absorbed in body better   Consider protonix for heartburn if increasing pepcid 20 mg 2x per day  Call in Sept. for colonoscopy referral  Organic oil of oregano for immune health  IMPRESSION: No significant abnormality or change.   RECOMMENDATION: Screening mammogram in one year if age 50 or above.  ACR BI-RADS CATEGORY 1 - Negative. Letter Sent: Normal Exam.  -  Electronically Signed by: Delma Post  Result Narrative 12/24/2018  MAMMO 3D TOMO SCREENING BILATERAL  DIGITAL BREAST TOMOSYNTHESIS (3-D MAMMOGRAPHY) WAS PERFORMED.  TECHNIQUE: Bilateral digital screening mammogram was obtained and compared to the previous exam. This mammogram was analyzed by a CAD (Computer-Aided Detection) system.  FINDINGS: There are scattered areas of fibroglandular density. There are no suspicious masses or calcifications. There is no significant change from the previous exam.       Iron Deficiency Anemia, Adult Iron-deficiency anemia is when you have a low amount of red blood cells or hemoglobin. This happens because you have too little iron in your body. Hemoglobin carries oxygen to parts of the body. Anemia can cause your body to not get enough oxygen. It may or may not cause symptoms. Follow these instructions at home: Medicines  Take over-the-counter and prescription medicines only as told by your doctor. This includes iron pills (supplements) and vitamins.  If you cannot handle taking iron pills by mouth, ask your doctor about getting iron through: ? A vein (intravenously). ? A shot (injection) into a muscle.  Take iron pills when your stomach is empty. If you cannot handle this, take them with food.  Do not drink milk or take antacids at the same time as your iron pills.  To prevent trouble pooping  (constipation), eat fiber or take medicine (stool softener) as told by your doctor. Eating and drinking   Talk with your doctor before changing the foods you eat. He or she may tell you to eat foods that have a lot of iron, such as: ? Liver. ? Lowfat (lean) beef. ? Breads and cereals that have iron added to them (fortified breads and cereals). ? Eggs. ? Dried fruit. ? Dark green, leafy vegetables.  Drink enough fluid to keep your pee (urine) clear or pale yellow.  Eat fresh fruits and vegetables that are high in vitamin C. They help your body to use iron. Foods with a lot of vitamin C include: ? Oranges. ? Peppers. ? Tomatoes. ? Mangoes. General instructions  Return to your normal activities as told by your doctor. Ask your doctor what activities are safe for you.  Keep yourself clean, and keep things clean around you (your surroundings). Anemia can make you get sick more easily.  Keep all follow-up visits as told by your doctor. This is important. Contact a doctor if:  You feel sick to your stomach (nauseous).  You throw up (vomit).  You feel weak.  You are sweating for no clear reason.  You have trouble pooping, such as: ? Pooping (having a bowel movement) less than 3 times a week. ? Straining to poop. ? Having poop that is hard, dry, or larger than normal. ? Feeling full or bloated. ? Pain in the lower belly. ? Not feeling better after pooping. Get help right away if:  You pass out (faint). If this happens, do not drive yourself to the hospital.  Call your local emergency services (911 in the [place]  You have chest pain.  You have shortness of breath that: ? Is very bad. ? Gets worse with physical activity.  You have a fast heartbeat.  You get light-headed when getting up from sitting or lying down. This information is not intended to replace advice given to you by your health care provider. Make sure you discuss any questions you have with your health care  provider. Document Revised: 04/17/2017 Document Reviewed: 01/23/2016 Elsevier Patient Education  [place].  Iron-Rich Diet  Iron is a mineral that helps your body to produce hemoglobin. Hemoglobin is a protein in red blood cells that carries oxygen to your body's tissues. Eating too little iron may cause you to feel weak and tired, and it can increase your risk of infection. Iron is naturally found in many foods, and many foods have iron added to them (iron-fortified foods). You may need to follow an iron-rich diet if you do not have enough iron in your body due to certain medical conditions. The amount of iron that you need each day depends on your age, your sex, and any medical conditions you have. Follow instructions from your health care provider or a diet and nutrition specialist (dietitian) about how much iron you should eat each day. What are tips for following this plan? Reading food labels  Check food labels to see how many milligrams (mg) of iron are in each serving. Cooking  Cook foods in pots and pans that are made from iron.  Take these steps to make it easier for your body to absorb iron from certain foods: ? Soak beans overnight before cooking. ? Soak whole grains overnight and drain them before using. ? Ferment flours before baking, such as by using yeast in bread dough. Meal planning  When you eat foods that contain iron, you should eat them with foods that are high in vitamin C. These include oranges, peppers, tomatoes, potatoes, and mango. Vitamin C helps your body to absorb iron. General information  Take iron supplements only as told by your health care provider. An overdose of iron can be life-threatening. If you were prescribed iron supplements, take them with orange juice or a vitamin C supplement.  When you eat iron-fortified foods or take an iron supplement, you should also eat foods that naturally contain iron, such as meat, poultry, and fish. Eating  naturally iron-rich foods helps your body to absorb the iron that is added to other foods or contained in a supplement.  Certain foods and drinks prevent your body from absorbing iron properly. Avoid eating these foods in the same meal as iron-rich foods or with iron supplements. These foods include: ? Coffee, black tea, and red wine. ? Milk, dairy products, and foods that are high in calcium. ? Beans and soybeans. ? Whole grains. What foods should I eat? Fruits Prunes. Raisins. Eat fruits high in vitamin C, such as oranges, grapefruits, and strawberries, alongside iron-rich foods. Vegetables Spinach (cooked). Green peas. Broccoli. Fermented vegetables. Eat vegetables high in vitamin C, such as leafy greens, potatoes, bell peppers, and tomatoes, alongside iron-rich foods. Grains Iron-fortified breakfast cereal. Iron-fortified whole-wheat bread. Enriched rice. Sprouted grains. Meats and other proteins Beef liver. Oysters. Beef. Shrimp. [place]. Chicken. [place]. Sardines. Chickpeas. Nuts. Tofu. Pumpkin seeds. Beverages Tomato juice. Fresh orange juice. Prune juice. Hibiscus tea. Fortified instant breakfast shakes. Sweets and desserts Blackstrap molasses. Seasonings and condiments Tahini. Fermented soy sauce. Other foods Wheat germ. The items  listed above may not be a complete list of recommended foods and beverages. Contact a dietitian for more information. What foods should I avoid? Grains Whole grains. Bran cereal. Bran flour. Oats. Meats and other proteins Soybeans. Products made from soy protein. Black beans. Lentils. Mung beans. Split peas. Dairy Milk. Cream. Cheese. Yogurt. Cottage cheese. Beverages Coffee. Black tea. Red wine. Sweets and desserts Cocoa. Chocolate. Ice cream. Other foods Basil. Oregano. Large amounts of parsley. The items listed above may not be a complete list of foods and beverages to avoid. Contact a dietitian for more information. Summary  Iron is a  mineral that helps your body to produce hemoglobin. Hemoglobin is a protein in red blood cells that carries oxygen to your body's tissues.  Iron is naturally found in many foods, and many foods have iron added to them (iron-fortified foods).  When you eat foods that contain iron, you should eat them with foods that are high in vitamin C. Vitamin C helps your body to absorb iron.  Certain foods and drinks prevent your body from absorbing iron properly, such as whole grains and dairy products. You should avoid eating these foods in the same meal as iron-rich foods or with iron supplements. This information is not intended to replace advice given to you by your health care provider. Make sure you discuss any questions you have with your health care provider. Document Revised: 04/17/2017 Document Reviewed: 03/31/2017 Elsevier Patient Education  [place].    High Cholesterol  High cholesterol is a condition in which the blood has high levels of a white, waxy, fat-like substance (cholesterol). The human body needs small amounts of cholesterol. The liver makes all the cholesterol that the body needs. Extra (excess) cholesterol comes from the food that we eat. Cholesterol is carried from the liver by the blood through the blood vessels. If you have high cholesterol, deposits (plaques) may build up on the walls of your blood vessels (arteries). Plaques make the arteries narrower and stiffer. Cholesterol plaques increase your risk for heart attack and stroke. Work with your health care provider to keep your cholesterol levels in a healthy range. What increases the risk? This condition is more likely to develop in people who:  Eat foods that are high in animal fat (saturated fat) or cholesterol.  Are overweight.  Are not getting enough exercise.  Have a family history of high cholesterol. What are the signs or symptoms? There are no symptoms of this condition. How is this diagnosed? This  condition may be diagnosed from the results of a blood test.  If you are older than age 32, your health care provider may check your cholesterol every 4-6 years.  You may be checked more often if you already have high cholesterol or other risk factors for heart disease. The blood test for cholesterol measures:  "Bad" cholesterol (LDL cholesterol). This is the main type of cholesterol that causes heart disease. The desired level for LDL is less than 100.  "Good" cholesterol (HDL cholesterol). This type helps to protect against heart disease by cleaning the arteries and carrying the LDL away. The desired level for HDL is 60 or higher.  Triglycerides. These are fats that the body can store or burn for energy. The desired number for triglycerides is lower than 150.  Total cholesterol. This is a measure of the total amount of cholesterol in your blood, including LDL cholesterol, HDL cholesterol, and triglycerides. A healthy number is less than 200. How is this treated?  This condition is treated with diet changes, lifestyle changes, and medicines. Diet changes  This may include eating more whole grains, fruits, vegetables, nuts, and fish.  This may also include cutting back on red meat and foods that have a lot of added sugar. Lifestyle changes  Changes may include getting at least 40 minutes of aerobic exercise 3 times a week. Aerobic exercises include walking, biking, and swimming. Aerobic exercise along with a healthy diet can help you maintain a healthy weight.  Changes may also include quitting smoking. Medicines  Medicines are usually given if diet and lifestyle changes have failed to reduce your cholesterol to healthy levels.  Your health care provider may prescribe a statin medicine. Statin medicines have been shown to reduce cholesterol, which can reduce the risk of heart disease. Follow these instructions at home: Eating and drinking If told by your health care provider:  Eat  chicken (without skin), fish, veal, shellfish, ground [place] breast, and round or loin cuts of red meat.  Do not eat fried foods or fatty meats, such as hot dogs and salami.  Eat plenty of fruits, such as apples.  Eat plenty of vegetables, such as broccoli, potatoes, and carrots.  Eat beans, peas, and lentils.  Eat grains such as barley, rice, couscous, and bulgur wheat.  Eat pasta without cream sauces.  Use skim or nonfat milk, and eat low-fat or nonfat yogurt and cheeses.  Do not eat or drink whole milk, cream, ice cream, egg yolks, or hard cheeses.  Do not eat stick margarine or tub margarines that contain trans fats (also called partially hydrogenated oils).  Do not eat saturated tropical oils, such as coconut oil and palm oil.  Do not eat cakes, cookies, crackers, or other baked goods that contain trans fats.  General instructions  Exercise as directed by your health care provider. Increase your activity level with activities such as gardening, walking, and taking the stairs.  Take over-the-counter and prescription medicines only as told by your health care provider.  Do not use any products that contain nicotine or tobacco, such as cigarettes and e-cigarettes. If you need help quitting, ask your health care provider.  Keep all follow-up visits as told by your health care provider. This is important. Contact a health care provider if:  You are struggling to maintain a healthy diet or weight.  You need help to start on an exercise program.  You need help to stop smoking. Get help right away if:  You have chest pain.  You have trouble breathing. This information is not intended to replace advice given to you by your health care provider. Make sure you discuss any questions you have with your health care provider. Document Revised: 05/08/2017 Document Reviewed: 11/03/2015 Elsevier Patient Education  [place].  Cholesterol Content in Foods Cholesterol is a  waxy, fat-like substance that helps to carry fat in the blood. The body needs cholesterol in small amounts, but too much cholesterol can cause damage to the arteries and heart. Most people should eat less than 200 milligrams (mg) of cholesterol a day. Foods with cholesterol  Cholesterol is found in animal-based foods, such as meat, seafood, and dairy. Generally, low-fat dairy and lean meats have less cholesterol than full-fat dairy and fatty meats. The milligrams of cholesterol per serving (mg per serving) of common cholesterol-containing foods are listed below. Meat and other proteins  Egg -- one large whole egg has 186 mg.  Veal shank -- 4 oz has 141 mg.  Lean ground [place] (93% lean) -- 4 oz has 118 mg.  Fat-trimmed lamb loin -- 4 oz has 106 mg.  Lean ground beef (90% lean) -- 4 oz has 100 mg.  Lobster -- 3.5 oz has 90 mg.  Pork loin chops -- 4 oz has 86 mg.  Canned salmon -- 3.5 oz has 83 mg.  Fat-trimmed beef top loin -- 4 oz has 78 mg.  Frankfurter -- 1 frank (3.5 oz) has 77 mg.  Crab -- 3.5 oz has 71 mg.  Roasted chicken without skin, white meat -- 4 oz has 66 mg.  Light bologna -- 2 oz has 45 mg.  Deli-cut [place] -- 2 oz has 31 mg.  Canned tuna -- 3.5 oz has 31 mg.  [place] Salines -- 1 oz has 29 mg.  Oysters and mussels (raw) -- 3.5 oz has 25 mg.  Mackerel -- 1 oz has 22 mg.  Trout -- 1 oz has 20 mg.  Pork sausage -- 1 link (1 oz) has 17 mg.  Salmon -- 1 oz has 16 mg.  Tilapia -- 1 oz has 14 mg. Dairy  Soft-serve ice cream --  cup (4 oz) has 103 mg.  Whole-milk yogurt -- 1 cup (8 oz) has 29 mg.  Cheddar cheese -- 1 oz has 28 mg.  American cheese -- 1 oz has 28 mg.  Whole milk -- 1 cup (8 oz) has 23 mg.  2% milk -- 1 cup (8 oz) has 18 mg.  Cream cheese -- 1 tablespoon (Tbsp) has 15 mg.  Cottage cheese --  cup (4 oz) has 14 mg.  Low-fat (1%) milk -- 1 cup (8 oz) has 10 mg.  Sour cream -- 1 Tbsp has 8.5 mg.  Low-fat yogurt -- 1 cup (8 oz) has 8  mg.  Nonfat Greek yogurt -- 1 cup (8 oz) has 7 mg.  Half-and-half cream -- 1 Tbsp has 5 mg. Fats and oils  Cod liver oil -- 1 tablespoon (Tbsp) has 82 mg.  Butter -- 1 Tbsp has 15 mg.  Lard -- 1 Tbsp has 14 mg.  Bacon grease -- 1 Tbsp has 14 mg.  Mayonnaise -- 1 Tbsp has 5-10 mg.  Margarine -- 1 Tbsp has 3-10 mg. Exact amounts of cholesterol in these foods may vary depending on specific ingredients and brands. Foods without cholesterol Most plant-based foods do not have cholesterol unless you combine them with a food that has cholesterol. Foods without cholesterol include:  Grains and cereals.  Vegetables.  Fruits.  Vegetable oils, such as olive, canola, and sunflower oil.  Legumes, such as peas, beans, and lentils.  Nuts and seeds.  Egg whites. Summary  The body needs cholesterol in small amounts, but too much cholesterol can cause damage to the arteries and heart.  Most people should eat less than 200 milligrams (mg) of cholesterol a day. This information is not intended to replace advice given to you by your health care provider. Make sure you discuss any questions you have with your health care provider. Document Revised: 04/17/2017 Document Reviewed: 12/30/2016 Elsevier Patient Education  [place].

## 2019-09-07 NOTE — Progress Notes (Signed)
Chief Complaint  Patient presents with  . Follow-up  . Medication Management    Bp has been good on Norvasc but makes the pt very tired/sleepy. She now takes this at night. Pepcid is no longer working for pt's reflux. She has to take this and a spoonful of mustard before there is a change.    F/u  1. HTN BP improved on norvasc 5 mg qhs due to am dose making her sleepy, lis hctz 20-12.5 x 2 pills daily in am BP at home 120s/80s 2. Endometrial cells on pap 08/2018 due for f/u ob/gyn Dr. Leonides Schanz 08/2019 pt reports will  Be on cycle 09/2019 early month and wants moved mid to late 09/2019. She does report heavy menses and iron deficiency noted in labs and some fatigue 3. Dry skin to face using dial soap  4. gerd pepcid 20 mg qd was helping at 1st  5. HLD changed diet and exercise and still smoking but trying to cut back    Review of Systems  Constitutional: Negative for malaise/fatigue.  HENT: Negative for hearing loss.   Eyes: Negative for blurred vision.  Respiratory: Negative for shortness of breath.   Cardiovascular: Negative for chest pain.  Gastrointestinal: Positive for heartburn.  Musculoskeletal: Negative for falls.  Skin: Positive for rash.  Psychiatric/Behavioral: Negative for depression.   Past Medical History:  Diagnosis Date  . Allergy   . Hypertension    Past Surgical History:  Procedure Laterality Date  . NO PAST SURGERIES     Family History  Problem Relation Age of Onset  . Hypertension Mother   . Hypertension Brother   . Hypertension Maternal Grandmother   . Hypertension Maternal Grandfather    Social History   Socioeconomic History  . Marital status: Married    Spouse name: Not on file  . Number of children: Not on file  . Years of education: Not on file  . Highest education level: Not on file  Occupational History  . Not on file  Tobacco Use  . Smoking status: Current Every Day Smoker  . Smokeless tobacco: Never Used  Substance and Sexual Activity  .  Alcohol use: Not Currently  . Drug use: Not Currently  . Sexual activity: Yes    Comment: women   Other Topics Concern  . 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 Difficulty of Paying Living Expenses:   Food Insecurity:   . Worried About Charity fundraiser in the Last Year:   . Arboriculturist in the Last Year:   Transportation Needs:   . Film/video editor (Medical):   Marland Kitchen Lack of Transportation (Non-Medical):   Physical Activity:   . Days of Exercise per Week:   . Minutes of Exercise per Session:   Stress:   . Feeling of Stress :   Social Connections:   . Frequency of Communication with Friends and Family:   . Frequency of Social Gatherings with Friends and Family:   . Attends Religious Services:   . Active Member of Clubs or Organizations:   . Attends Archivist Meetings:   Marland Kitchen Marital Status:   Intimate Partner Violence:   . Fear of  Current or Ex-Partner:   . Emotionally Abused:   Marland Kitchen Physically Abused:   . Sexually Abused:    Current Meds  Medication Sig  . amLODipine (NORVASC) 5 MG tablet Take 1 tablet (5 mg total) by mouth daily.  . cetirizine (ZYRTEC) 10 MG tablet Take 1 tablet (10 mg total) by mouth daily.  . Cholecalciferol (DIALYVITE VITAMIN D 5000 PO) Take 5,000 Units by mouth daily.  . famotidine (PEPCID) 20 MG tablet Take 1 tablet (20 mg total) by mouth 2 (two) times daily. In am  . lisinopril-hydrochlorothiazide (ZESTORETIC) 20-12.5 MG tablet Take 2 tablets by mouth daily.  . montelukast (SINGULAIR) 10 MG tablet Take 1 tablet (10 mg total) by mouth at bedtime.  . OMEGA-3 FATTY ACIDS PO Take by mouth.  . [DISCONTINUED] Cholecalciferol 1.25 MG (50000 UT) capsule Take 1 capsule (50,000 Units total) by mouth every 30 (thirty) days.  . [DISCONTINUED]  famotidine (PEPCID) 20 MG tablet Take 1 tablet (20 mg total) by mouth daily. In am   No Known Allergies Recent Results (from the past 2160 hour(s))  CBC with Differential/Platelet     Status: Abnormal   Collection Time: 06/30/19 12:00 AM  Result Value Ref Range   WBC 9.1 3.4 - 10.8 x10E3/uL   RBC 4.94 3.77 - 5.28 x10E6/uL   Hemoglobin 11.8 11.1 - 15.9 g/dL   Hematocrit 37.3 34.0 - 46.6 %   MCV 76 (L) 79 - 97 fL   MCH 23.9 (L) 26.6 - 33.0 pg   MCHC 31.6 31.5 - 35.7 g/dL   RDW 17.4 (H) 11.7 - 15.4 %   Platelets 418 150 - 450 x10E3/uL   Neutrophils 65 Not Estab. %   Lymphs 24 Not Estab. %   Monocytes 8 Not Estab. %   Eos 2 Not Estab. %   Basos 1 Not Estab. %   Neutrophils Absolute 5.9 1.4 - 7.0 x10E3/uL   Lymphocytes Absolute 2.2 0.7 - 3.1 x10E3/uL   Monocytes Absolute 0.7 0.1 - 0.9 x10E3/uL   EOS (ABSOLUTE) 0.2 0.0 - 0.4 x10E3/uL   Basophils Absolute 0.1 0.0 - 0.2 x10E3/uL   Immature Granulocytes 0 Not Estab. %   Immature Grans (Abs) 0.0 0.0 - 0.1 x10E3/uL  Comprehensive metabolic panel     Status: Abnormal   Collection Time: 06/30/19 12:00 AM  Result Value Ref Range   Glucose 94 65 - 99 mg/dL   BUN 12 6 - 24 mg/dL   Creatinine, Ser 0.88 0.57 - 1.00 mg/dL   GFR calc non Af Amer 77 >59 mL/min/1.73   GFR calc Af Amer 89 >59 mL/min/1.73   BUN/Creatinine Ratio 14 9 - 23   Sodium 136 134 - 144 mmol/L   Potassium 3.9 3.5 - 5.2 mmol/L   Chloride 95 (L) 96 - 106 mmol/L   CO2 26 20 - 29 mmol/L   Calcium 9.1 8.7 - 10.2 mg/dL   Total Protein 7.3 6.0 - 8.5 g/dL   Albumin 3.9 3.8 - 4.8 g/dL   Globulin, Total 3.4 1.5 - 4.5 g/dL   Albumin/Globulin Ratio 1.1 (L) 1.2 - 2.2   Bilirubin Total 0.2 0.0 - 1.2 mg/dL   Alkaline Phosphatase 83 39 - 117 IU/L   AST 13 0 - 40 IU/L   ALT 14 0 - 32 IU/L  Urinalysis, Routine w reflex microscopic     Status: Abnormal   Collection Time: 06/30/19 12:00 AM  Result Value Ref Range   Specific Gravity, UA 1.008 1.005 - 1.030   pH,  UA 6.5 5.0 - 7.5    Color, UA Yellow Yellow   Appearance Ur Clear Clear   Leukocytes,UA Trace (A) Negative   Protein,UA Negative Negative/Trace   Glucose, UA Negative Negative   Ketones, UA Negative Negative   RBC, UA Negative Negative   Bilirubin, UA Negative Negative   Urobilinogen, Ur 0.2 0.2 - 1.0 mg/dL   Nitrite, UA Negative Negative   Microscopic Examination See below:     Comment: Microscopic was indicated and was performed.  Microscopic Examination     Status: None   Collection Time: 06/30/19 12:00 AM  Result Value Ref Range   WBC, UA 0-5 0 - 5 /hpf   RBC 0-2 0 - 2 /hpf   Epithelial Cells (non renal) 0-10 0 - 10 /hpf   Casts None seen None seen /lpf   Mucus, UA Present Not Estab.   Bacteria, UA Few None seen/Few  Lipid Panel w/o Chol/HDL Ratio     Status: Abnormal   Collection Time: 06/30/19 12:00 AM  Result Value Ref Range   Cholesterol, Total 207 (H) 100 - 199 mg/dL   Triglycerides 144 0 - 149 mg/dL   HDL 37 (L) >39 mg/dL   VLDL Cholesterol Cal 26 5 - 40 mg/dL   LDL Chol Calc (NIH) 144 (H) 0 - 99 mg/dL  Iron and TIBC     Status: Abnormal   Collection Time: 06/30/19 12:00 AM  Result Value Ref Range   Total Iron Binding Capacity 345 250 - 450 ug/dL   UIBC 324 131 - 425 ug/dL   Iron 21 (L) 27 - 159 ug/dL   Iron Saturation 6 (LL) 15 - 55 %  TSH     Status: None   Collection Time: 06/30/19 12:00 AM  Result Value Ref Range   TSH 1.270 0.450 - 4.500 uIU/mL  VITAMIN D 25 Hydroxy (Vit-D Deficiency, Fractures)     Status: None   Collection Time: 06/30/19 12:00 AM  Result Value Ref Range   Vit D, 25-Hydroxy 60.0 30.0 - 100.0 ng/mL    Comment: Vitamin D deficiency has been defined by the [place] and an Endocrine Society practice guideline as a level of serum 25-OH vitamin D less than 20 ng/mL (1,2). The Endocrine Society went on to further define vitamin D insufficiency as a level between 21 and 29 ng/mL (2). 1. IOM (Institute of Medicine). 2010. Dietary reference    intakes  for calcium and D. [place]: The    [employer]. 2. Holick MF, Binkley [place], Bischoff-Ferrari HA, et al.    Evaluation, treatment, and prevention of vitamin D    deficiency: an Endocrine Society clinical practice    guideline. JCEM. 2011 Jul; 96(7):1911-30.   Ferritin     Status: Abnormal   Collection Time: 06/30/19 12:00 AM  Result Value Ref Range   Ferritin 13 (L) 15 - 150 ng/mL   Objective  Body mass index is 49.78 kg/m. Wt Readings from Last 3 Encounters:  09/07/19 281 lb (127.5 kg)  05/25/19 284 lb (128.8 kg)  02/15/19 284 lb 12.8 oz (129.2 kg)   Temp Readings from Last 3 Encounters:  09/07/19 (!) 97.4 F (36.3 C) (Temporal)  02/15/19 98.7 F (37.1 C) (Oral)  02/08/19 98.5 F (36.9 C) (Oral)   BP Readings from Last 3 Encounters:  09/07/19 117/74  05/25/19 (!) 150/80  02/15/19 (!) 174/100   Pulse Readings from Last 3 Encounters:  09/07/19 88  02/15/19 89  02/08/19 89  Physical Exam Vitals and nursing note reviewed.  Constitutional:      Appearance: Normal appearance. She is well-developed and well-groomed. She is morbidly obese.  HENT:     Head: Normocephalic and atraumatic.  Eyes:     Conjunctiva/sclera: Conjunctivae normal.     Pupils: Pupils are equal, round, and reactive to light.  Cardiovascular:     Rate and Rhythm: Normal rate and regular rhythm.     Heart sounds: Normal heart sounds. No murmur.  Pulmonary:     Effort: Pulmonary effort is normal.     Breath sounds: Normal breath sounds.  Skin:    General: Skin is warm and dry.     Comments: seb derm to face and scalp    Neurological:     General: No focal deficit present.     Mental Status: She is alert and oriented to person, place, and time. Mental status is at baseline.     Gait: Gait normal.  Psychiatric:        Attention and Perception: Attention and perception normal.        Mood and Affect: Mood and affect normal.        Speech: Speech normal.        Behavior:  Behavior normal. Behavior is cooperative.        Thought Content: Thought content normal.        Cognition and Memory: Cognition and memory normal.        Judgment: Judgment normal.     Assessment  Plan  Seborrheic dermatitis, unspecified - Plan: ketoconazole (NIZORAL) 2 % shampoo, hydrocortisone 2.5 % lotion rec cetaphil/cerave  Gastroesophageal reflux disease - Plan: famotidine (PEPCID) 20 MG tablet bid  Unexplained endometrial cells on cervical cytology F/u Dr. Blima Rich ward   Essential hypertension  Cont meds improved  Monitor BP   HM Declines flu shot  Tdap had 06/08/14 ImmuneMMR, hep Bqual + consider quant in future [facility] labs done 06/30/19  Declines STD check  Neg mammo due 12/08/18, 8/[facility]  Left breast lesion resolved not need dx mammo as of 09/07/19  Pap 08/23/2018 [facility] neg pap neg HPV due in 3 yearsbut addendum stated + endometrial cells will refer back to ob/gynjust finished cycle at this time ob/gyn rec f/u in 1 year  -referred back due 08/23/19 [facility] appt sch 08/24/19 Dr. Leonides Schanz   Colonoscopy pt will message me if interested in near future disc 01/2019 and 04/2019 or 05/25/19 pt wants to wait for now but let me know when ready   rec mvt with iron   Provider: Dr. Olivia Mackie McLean-Scocuzza-Internal Medicine

## 2019-09-28 ENCOUNTER — Telehealth: Payer: Self-pay | Admitting: Internal Medicine

## 2019-09-28 DIAGNOSIS — I10 Essential (primary) hypertension: Secondary | ICD-10-CM

## 2019-09-28 MED ORDER — LISINOPRIL-HYDROCHLOROTHIAZIDE 20-12.5 MG PO TABS: 2.0000 | ORAL_TABLET | Freq: Every day | ORAL | 1 refills | Status: DC

## 2019-09-28 NOTE — Telephone Encounter (Signed)
Pt is completely out of lisinopril-hydrochlorothiazide (ZESTORETIC) 20-12.5 MG tablet. She states that drugstore will not refill until the 16th but she needs it asap. Please advise

## 2019-12-07 DIAGNOSIS — Z1231 Encounter for screening mammogram for malignant neoplasm of breast: Secondary | ICD-10-CM | POA: Diagnosis not present

## 2019-12-07 LAB — HM MAMMOGRAPHY

## 2019-12-11 ENCOUNTER — Encounter: Payer: Self-pay | Admitting: Internal Medicine

## 2020-03-09 ENCOUNTER — Other Ambulatory Visit: Payer: Self-pay

## 2020-03-09 ENCOUNTER — Encounter: Payer: Self-pay | Admitting: Internal Medicine

## 2020-03-09 ENCOUNTER — Ambulatory Visit (INDEPENDENT_AMBULATORY_CARE_PROVIDER_SITE_OTHER): Payer: BC Managed Care – PPO | Admitting: Internal Medicine

## 2020-03-09 VITALS — BP 122/80 | HR 86 | Temp 98.3°F | Ht 63.0 in | Wt 276.6 lb

## 2020-03-09 DIAGNOSIS — L219 Seborrheic dermatitis, unspecified: Secondary | ICD-10-CM

## 2020-03-09 DIAGNOSIS — Z13818 Encounter for screening for other digestive system disorders: Secondary | ICD-10-CM

## 2020-03-09 DIAGNOSIS — Z1159 Encounter for screening for other viral diseases: Secondary | ICD-10-CM

## 2020-03-09 DIAGNOSIS — G8929 Other chronic pain: Secondary | ICD-10-CM

## 2020-03-09 DIAGNOSIS — M25561 Pain in right knee: Secondary | ICD-10-CM

## 2020-03-09 DIAGNOSIS — Z6841 Body Mass Index (BMI) 40.0 and over, adult: Secondary | ICD-10-CM

## 2020-03-09 DIAGNOSIS — L299 Pruritus, unspecified: Secondary | ICD-10-CM | POA: Diagnosis not present

## 2020-03-09 DIAGNOSIS — Z0184 Encounter for antibody response examination: Secondary | ICD-10-CM

## 2020-03-09 DIAGNOSIS — I10 Essential (primary) hypertension: Secondary | ICD-10-CM

## 2020-03-09 DIAGNOSIS — Z0000 Encounter for general adult medical examination without abnormal findings: Secondary | ICD-10-CM | POA: Diagnosis not present

## 2020-03-09 DIAGNOSIS — M25562 Pain in left knee: Secondary | ICD-10-CM

## 2020-03-09 DIAGNOSIS — Z1211 Encounter for screening for malignant neoplasm of colon: Secondary | ICD-10-CM

## 2020-03-09 DIAGNOSIS — E559 Vitamin D deficiency, unspecified: Secondary | ICD-10-CM

## 2020-03-09 MED ORDER — FLUOCINOLONE ACETONIDE SCALP 0.01 % EX OIL: TOPICAL_OIL | CUTANEOUS | 5 refills | Status: DC

## 2020-03-09 MED ORDER — FLUOCINOLONE ACETONIDE SCALP 0.01 % EX OIL: 1.0000 "application " | TOPICAL_OIL | Freq: Every day | CUTANEOUS | 5 refills | Status: DC | PRN

## 2020-03-09 NOTE — Progress Notes (Signed)
Chief Complaint  Patient presents with   Follow-up   Annual  1. BP controlled on norvasc 5 mg qd and lis-hctz 20-12.5 (2 pills) daily and improved  2. Weight trending down from 284 to 276 walking 5 days a week at work from 3K-7K steps has pedometer and wants to increase to 10K and trying to eat healthy and will increase walking to 7 days per week  3. seb derm to scalp not using nizoral shampoo much due to scalp still itching will try topical steroid to scalp  4. Walking trying to lose is triggering knee pain b/l mild to moderate agreeable to knee brace   Review of Systems  Constitutional: Positive for weight loss.  HENT: Negative for hearing loss.   Eyes: Negative for blurred vision.  Respiratory: Negative for shortness of breath.   Cardiovascular: Negative for chest pain.  Gastrointestinal: Negative for abdominal pain.  Musculoskeletal: Positive for joint pain.  Skin: Negative for rash.  Psychiatric/Behavioral: Negative for depression.   Past Medical History:  Diagnosis Date   Allergy    Hypertension    Past Surgical History:  Procedure Laterality Date   NO PAST SURGERIES     Family History  Problem Relation Age of Onset   Hypertension Mother    Hypertension Brother    Hypertension Maternal Grandmother    Hypertension Maternal Grandfather    Social History   Socioeconomic History   Marital status: Married    Spouse name: Not on file   Number of children: Not on file   Years of education: Not on file   Highest education level: Not on file  Occupational History   Not on file  Tobacco Use   Smoking status: Current Every Day Smoker   Smokeless tobacco: Never Used  Substance and Sexual Activity   Alcohol use: Not Currently   Drug use: Not Currently   Sexual activity: Yes    Comment: women   Other Topics Concern   Not on file  Social History Narrative   Divorced    Prefers women   Smoker    Owns guns    Wears seat belt    Safe in  relationship female   Some college    Estate manager/land agent    Social Determinants of Health   Financial Resource Strain:    Difficulty of Paying Living Expenses: Not on file  Food Insecurity:    Worried About Charity fundraiser in the Last Year: Not on file   [employer] of Food in the Last Year: Not on file  Transportation Needs:    Lack of Transportation (Medical): Not on file   Lack of Transportation (Non-Medical): Not on file  Physical Activity:    Days of Exercise per Week: Not on file   Minutes of Exercise per Session: Not on file  Stress:    Feeling of Stress : Not on file  Social Connections:    Frequency of Communication with Friends and Family: Not on file   Frequency of Social Gatherings with Friends and Family: Not on file   Attends Religious Services: Not on file   Active Member of Clubs or Organizations: Not on file   Attends Archivist Meetings: Not on file   Marital Status: Not on file  Intimate Partner Violence:    Fear of Current or Ex-Partner: Not on file   Emotionally Abused: Not on file   Physically Abused: Not on file   Sexually Abused: Not on file  Current Meds  Medication Sig   amLODipine (NORVASC) 5 MG tablet Take 1 tablet (5 mg total) by mouth daily.   atorvastatin (LIPITOR) 10 MG tablet Take 1 tablet (10 mg total) by mouth daily at 6 PM. At night   cetirizine (ZYRTEC) 10 MG tablet Take 1 tablet (10 mg total) by mouth daily.   Cholecalciferol (DIALYVITE VITAMIN D 5000 PO) Take 5,000 Units by mouth daily.   famotidine (PEPCID) 20 MG tablet Take 1 tablet (20 mg total) by mouth 2 (two) times daily. In am   hydrocortisone 2.5 % lotion Apply topically 2 (two) times daily. face   ketoconazole (NIZORAL) 2 % shampoo Apply 1 application topically 2 (two) times a week. Let sit 5 minutes   lisinopril-hydrochlorothiazide (ZESTORETIC) 20-12.5 MG tablet Take 2 tablets by mouth daily.   montelukast (SINGULAIR) 10 MG tablet Take 1  tablet (10 mg total) by mouth at bedtime.   OMEGA-3 FATTY ACIDS PO Take by mouth.   No Known Allergies No results found for this or any previous visit (from the past 2160 hour(s)). Objective  Body mass index is 49 kg/m. Wt Readings from Last 3 Encounters:  03/09/20 276 lb 9.6 oz (125.5 kg)  09/07/19 281 lb (127.5 kg)  05/25/19 284 lb (128.8 kg)   Temp Readings from Last 3 Encounters:  03/09/20 98.3 F (36.8 C) (Oral)  09/07/19 (!) 97.4 F (36.3 C) (Temporal)  02/15/19 98.7 F (37.1 C) (Oral)   BP Readings from Last 3 Encounters:  03/09/20 122/80  09/07/19 117/74  05/25/19 (!) 150/80   Pulse Readings from Last 3 Encounters:  03/09/20 86  09/07/19 88  02/15/19 89    Physical Exam Vitals and nursing note reviewed.  Constitutional:      Appearance: Normal appearance. She is well-developed and well-groomed. She is morbidly obese.  HENT:     Head: Normocephalic and atraumatic.  Eyes:     Conjunctiva/sclera: Conjunctivae normal.     Pupils: Pupils are equal, round, and reactive to light.  Cardiovascular:     Rate and Rhythm: Normal rate and regular rhythm.     Heart sounds: Normal heart sounds. No murmur heard.   Pulmonary:     Effort: Pulmonary effort is normal.     Breath sounds: Normal breath sounds.  Skin:    General: Skin is warm and dry.  Neurological:     General: No focal deficit present.     Mental Status: She is alert and oriented to person, place, and time. Mental status is at baseline.     Gait: Gait normal.  Psychiatric:        Attention and Perception: Attention and perception normal.        Mood and Affect: Mood and affect normal.        Speech: Speech normal.        Behavior: Behavior normal. Behavior is cooperative.        Thought Content: Thought content normal.        Cognition and Memory: Cognition and memory normal.        Judgment: Judgment normal.     Assessment  Plan  Annual physical exam Declines flu shot and covid vaccine for now  disc pfizer consider Tdap had 06/08/14 ImmuneMMR, hep Bqual + consider quant in future [facility] labs done 06/30/19  Declines STD check  Neg mammo due 12/08/18, 8/[facility]  Left breast lesion resolved not need dx mammo as of 09/07/19 mammo 12/07/19 negative   Pap 08/23/2018 [facility]  neg pap neg HPV due in 3 yearsbut addendum stated + endometrial cells will refer back to ob/gynjust finished cycle at this time ob/gyn rec f/u in 1 year  -[facility] 08/24/19 Dr. Leonides Schanz -cc her again 03/09/20   Colonoscopy referred wants to see 05/2020 [facility]   Smoking cut back rec stop pt has reduced   rec mvt with iron    Chronic pain of both knees voltren gel  Knee brace b/l today Call back if want to see ortho   Itchy scalp - Plan: Fluocinolone Acetonide Scalp 0.01 % OIL, ketaconazole shampoo Seborrheic dermatitis  See above  Morbid obesity with BMI of 45.0-49.9, adult (HCC)  Losing cont healthy diet and exercise   htn improved on lis 20-12.5 mg 2 pills qd and norvasc 5 mg qd   Provider: Dr. Olivia Mackie McLean-Scocuzza-Internal Medicine

## 2020-03-09 NOTE — Patient Instructions (Signed)
voltaren gel

## 2020-03-15 ENCOUNTER — Telehealth: Payer: Self-pay | Admitting: Internal Medicine

## 2020-03-15 NOTE — Telephone Encounter (Signed)
Patient informed of [facility] labs printed and placed upfront.

## 2020-03-28 ENCOUNTER — Telehealth: Payer: Self-pay | Admitting: Internal Medicine

## 2020-03-28 ENCOUNTER — Encounter: Payer: Self-pay | Admitting: Internal Medicine

## 2020-03-28 NOTE — Telephone Encounter (Signed)
Pt wanted a call back to discuss a boil on her left breast she said that she had one last year and was given medication and it cleared up Pt is schedule for an appt tomorrow she wanted to know if it could be VV or did Dr. Olivia Mackie want to see her in office

## 2020-03-29 ENCOUNTER — Telehealth: Payer: Self-pay | Admitting: Internal Medicine

## 2020-03-29 ENCOUNTER — Encounter: Payer: Self-pay | Admitting: Internal Medicine

## 2020-03-29 ENCOUNTER — Ambulatory Visit (INDEPENDENT_AMBULATORY_CARE_PROVIDER_SITE_OTHER): Payer: BC Managed Care – PPO | Admitting: Internal Medicine

## 2020-03-29 ENCOUNTER — Other Ambulatory Visit: Payer: Self-pay

## 2020-03-29 VITALS — BP 120/80 | HR 85 | Temp 97.7°F | Ht 63.0 in | Wt 272.0 lb

## 2020-03-29 DIAGNOSIS — N644 Mastodynia: Secondary | ICD-10-CM

## 2020-03-29 DIAGNOSIS — N939 Abnormal uterine and vaginal bleeding, unspecified: Secondary | ICD-10-CM

## 2020-03-29 DIAGNOSIS — N61 Mastitis without abscess: Secondary | ICD-10-CM | POA: Diagnosis not present

## 2020-03-29 MED ORDER — DOXYCYCLINE HYCLATE 100 MG PO TABS: 100.0000 mg | ORAL_TABLET | Freq: Two times a day (BID) | ORAL | 0 refills | Status: DC

## 2020-03-29 MED ORDER — MUPIROCIN 2 % EX OINT: 1.0000 "application " | TOPICAL_OINTMENT | Freq: Two times a day (BID) | CUTANEOUS | 2 refills | Status: DC

## 2020-03-29 NOTE — Progress Notes (Signed)
Chief Complaint  Patient presents with  . Recurrent Skin Infections    on the breast    1. Left nipple pain and redness x 4 days this also happened last year and in 2016 and now pain 8/10 tried tylenol  2. AUB had cycle 2x last month light brown then heavier blood then 2 weeks later on Sunday had again  appt Dr. Georgianne Fick 05/07/20    Review of Systems  Genitourinary:       +left nipple soreness recurrent     Past Medical History:  Diagnosis Date  . Allergy   . Hypertension    Past Surgical History:  Procedure Laterality Date  . NO PAST SURGERIES     Family History  Problem Relation Age of Onset  . Hypertension Mother   . Hypertension Brother   . Hypertension Maternal Grandmother   . Hypertension Maternal Grandfather    Social History   Socioeconomic History  . Marital status: Married    Spouse name: Not on file  . Number of children: Not on file  . Years of education: Not on file  . Highest education level: Not on file  Occupational History  . Not on file  Tobacco Use  . Smoking status: Current Every Day Smoker  . Smokeless tobacco: Never Used  Substance and Sexual Activity  . Alcohol use: Not Currently  . Drug use: Not Currently  . Sexual activity: Yes    Comment: women   Other Topics Concern  . 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 Difficulty of Paying Living Expenses: Not on file  Food Insecurity:   . Worried About Charity fundraiser in the Last Year: Not on file  . Ran Out of Food in the Last Year: Not on file  Transportation Needs:   . Lack of Transportation (Medical): Not on file  . Lack of Transportation (Non-Medical): Not on file  Physical Activity:   . Days of Exercise per Week: Not on file  . Minutes of Exercise per Session: Not on file    Stress:   . Feeling of Stress : Not on file  Social Connections:   . Frequency of Communication with Friends and Family: Not on file  . Frequency of Social Gatherings with Friends and Family: Not on file  . Attends Religious Services: Not on file  . Active Member of Clubs or Organizations: Not on file  . Attends Archivist Meetings: Not on file  . Marital Status: Not on file  Intimate Partner Violence:   . Fear of Current or Ex-Partner: Not on file  . Emotionally Abused: Not on file  . Physically Abused: Not on file  . Sexually Abused: Not on file   Current Meds  Medication Sig  . amLODipine (NORVASC) 5 MG tablet Take 1 tablet (5 mg total) by mouth daily.  Marland Kitchen atorvastatin (LIPITOR) 10 MG tablet Take 1 tablet (10 mg total) by mouth daily at 6 PM. At night  . cetirizine (ZYRTEC) 10 MG tablet Take 1 tablet (10 mg total) by mouth daily.  . Cholecalciferol (DIALYVITE VITAMIN D 5000 PO) Take 5,000 Units by mouth daily.  . famotidine (PEPCID) 20 MG tablet Take  1 tablet (20 mg total) by mouth 2 (two) times daily. In am (Patient taking differently: Take 20 mg by mouth 2 (two) times daily. In am, otc generic)  . Fluocinolone Acetonide Scalp 0.01 % OIL Apply 1-2 x per week prn 1 ounces OR leave on scalp overnight>4 hrs before washing out  . hydrocortisone 2.5 % lotion Apply topically 2 (two) times daily. face  . ketoconazole (NIZORAL) 2 % shampoo Apply 1 application topically 2 (two) times a week. Let sit 5 minutes  . lisinopril-hydrochlorothiazide (ZESTORETIC) 20-12.5 MG tablet Take 2 tablets by mouth daily.  . montelukast (SINGULAIR) 10 MG tablet Take 1 tablet (10 mg total) by mouth at bedtime.  . OMEGA-3 FATTY ACIDS PO Take by mouth.   No Known Allergies No results found for this or any previous visit (from the past 2160 hour(s)). Objective  Body mass index is 48.18 kg/m. Wt Readings from Last 3 Encounters:  03/29/20 272 lb (123.4 kg)  03/09/20 276 lb 9.6 oz (125.5 kg)   09/07/19 281 lb (127.5 kg)   Temp Readings from Last 3 Encounters:  03/29/20 97.7 F (36.5 C) (Oral)  03/09/20 98.3 F (36.8 C) (Oral)  09/07/19 (!) 97.4 F (36.3 C) (Temporal)   BP Readings from Last 3 Encounters:  03/29/20 120/80  03/09/20 122/80  09/07/19 117/74   Pulse Readings from Last 3 Encounters:  03/29/20 85  03/09/20 86  09/07/19 88    Physical Exam Vitals and nursing note reviewed.  Constitutional:      Appearance: Normal appearance. She is well-developed and well-groomed. She is morbidly obese.  HENT:     Head: Normocephalic and atraumatic.  Cardiovascular:     Rate and Rhythm: Normal rate and regular rhythm.     Heart sounds: Normal heart sounds. No murmur heard.   Pulmonary:     Effort: Pulmonary effort is normal.     Breath sounds: Normal breath sounds.  Chest:    Skin:    General: Skin is warm and dry.  Neurological:     General: No focal deficit present.     Mental Status: She is alert and oriented to person, place, and time. Mental status is at baseline.     Gait: Gait normal.  Psychiatric:        Attention and Perception: Attention and perception normal.        Mood and Affect: Mood and affect normal.        Speech: Speech normal.        Behavior: Behavior normal. Behavior is cooperative.        Thought Content: Thought content normal.        Cognition and Memory: Cognition and memory normal.        Judgment: Judgment normal.     Assessment  Plan  Breast pain, left - Plan: MM DIAG BREAST TOMO UNI LEFT, US BREAST LTD UNI LEFT INC AXILLA  Mastitis - Plan: doxycycline (VIBRA-TABS) 100 MG tablet, mupirocin ointment (BACTROBAN) 2 %, MM DIAG BREAST TOMO UNI LEFT, US BREAST LTD UNI LEFT INC AXILLA Consider breast surgery    Abnormal uterine bleeding (AUB)  appt Dr. Georgianne Fick 05/07/20   Provider: Dr. Olivia Mackie McLean-Scocuzza-Internal Medicine

## 2020-03-29 NOTE — Patient Instructions (Signed)
Breast Cyst  A breast cyst is a sac in the breast that is filled with fluid. They are usually noncancerous (benign) and are common among women. Breast cysts are most often in the upper, outer portion of the breast. One or more cysts may develop. They form when fluid builds up inside the breast glands. There are several types of breast cysts. Some are too small to feel, but these can be seen with imaging tests such as an X-ray of the breast (mammogram) or ultrasound. Breast cysts do not increase your risk of breast cancer. They usually disappear after you no longer have a menstrual cycle (after menopause), unless you take artificial hormones (are on hormone therapy). What are the causes? This condition may be caused by:  Blockage of tubes (ducts) in the breast glands, which leads to fluid buildup. Duct blockage may result from: ? Fibrocystic breast changes. This is a common, benign condition that occurs when women go through hormonal changes during the menstrual cycle. This is a common cause of multiple breast cysts. ? Overgrowth of breast tissue or breast glands. ? Scar tissue in the breast from previous surgery.  Changes in certain female hormones (estrogen and progesterone). The exact cause of this condition is not known. What increases the risk? You may be more likely to develop breast cysts if you have not gone through menopause. What are the signs or symptoms? Symptoms of this condition include:  Feeling one or more smooth, round, soft lumps (like grapes) in the breast that are easily movable. The lump or lumps may get bigger and more painful before your menstrual period and get smaller after your menstrual period.  Breast discomfort or pain. How is this diagnosed? This condition may be diagnosed based on:  A physical exam. A cyst can be felt by your health care provider during this exam.  Imaging tests, such as mammogram or ultrasound. Fluid may be removed from the cyst with a  needle (fine-needle aspiration) and tested to make sure the cyst is not cancerous. How is this treated? Treatment may not be needed for this condition. Your health care provider may monitor the cyst to see if it goes away on its own. If the cyst is uncomfortable or gets bigger, or if you do not like how the cyst makes your breast look, you may need treatment. Treatment may include:  Hormone therapy.  Fine-needle aspiration to drain fluid from the cyst. There is a chance of the cyst coming back (recurring) after aspiration.  Surgery to remove the cyst. Follow these instructions at home: Self-exams   Do a breast self-exam every month, or as often as directed. A breast self-exam involves: ? Comparing your breasts in the mirror. ? Looking for visible changes in your skin or nipples. ? Feeling for lumps or changes.  Having many breast cysts may make it harder to feel for new lumps. Understand how your breasts normally look and feel, and write down any changes in your breasts. Tell your health care provider about any changes. Eating and drinking  Follow instructions from your health care provider about eating and drinking restrictions.  Drink enough fluid to keep your urine pale yellow.  Avoid caffeine.  Cut down on salt (sodium) in what you eat and drink, especially before your menstrual period. Too much sodium can cause fluid buildup, breast swelling, and discomfort. General instructions  See your health care provider regularly. ? Get a yearly physical exam. ? If you are 67-30 years old, get a  clinical breast exam every 1-3 years. After the age of 23 years, get this exam every year. ? Get mammograms as often as directed.  Take over-the-counter and prescription medicines only as told by your health care provider.  Wear a supportive bra, especially when exercising.  Keep all follow-up visits as told by your health care provider. This is important. Contact a health care provider  if:  You feel, or think you feel, a lump in your breast.  You notice that both breasts look or feel different than usual.  Your breast is still causing pain after your menstrual period is over.  You find new lumps or bumps that were not there before.  You feel lumps in your armpit. Get help right away if:  You have severe pain, tenderness, redness, or warmth in your breast.  You have fluid or blood leaking from your nipple.  Your breast lump becomes hard and painful.  You notice dimpling or wrinkling of the breast or nipple. Summary  A breast cyst is a sac in the breast that is filled with fluid.  Treatment may not be needed for this condition.  If the cyst is uncomfortable or gets bigger, or if you do not like how the cyst makes your breast look, you may need treatment. This information is not intended to replace advice given to you by your health care provider. Make sure you discuss any questions you have with your health care provider. Document Revised: 09/21/2018 Document Reviewed: 09/21/2018 Elsevier Patient Education  [place].

## 2020-03-29 NOTE — Telephone Encounter (Signed)
Patient was seen in office today.  

## 2020-03-29 NOTE — Telephone Encounter (Signed)
I spoke with pt she states she forgot the lab form. Please call pt @ [phone]. Thank you!

## 2020-03-30 NOTE — Telephone Encounter (Signed)
Lab orders from 03/09/20 printed and faxed to the Patient

## 2020-04-02 ENCOUNTER — Other Ambulatory Visit: Payer: Self-pay | Admitting: Internal Medicine

## 2020-04-02 DIAGNOSIS — B373 Candidiasis of vulva and vagina: Secondary | ICD-10-CM

## 2020-04-02 DIAGNOSIS — B3731 Acute candidiasis of vulva and vagina: Secondary | ICD-10-CM

## 2020-04-02 MED ORDER — FLUCONAZOLE 150 MG PO TABS: 150.0000 mg | ORAL_TABLET | Freq: Once | ORAL | 0 refills | Status: AC

## 2020-04-06 ENCOUNTER — Telehealth: Payer: Self-pay | Admitting: Internal Medicine

## 2020-04-06 NOTE — Telephone Encounter (Signed)
Pt returned your call.  

## 2020-04-06 NOTE — Telephone Encounter (Signed)
lft vm for pt to call ofc

## 2020-04-09 NOTE — Telephone Encounter (Signed)
Thank you :)

## 2020-04-11 ENCOUNTER — Telehealth: Payer: Self-pay | Admitting: Internal Medicine

## 2020-04-11 NOTE — Telephone Encounter (Signed)
lft vm for pt to call ofc

## 2020-04-17 ENCOUNTER — Telehealth: Payer: Self-pay | Admitting: Internal Medicine

## 2020-04-17 NOTE — Telephone Encounter (Signed)
err

## 2020-05-01 ENCOUNTER — Telehealth: Payer: Self-pay | Admitting: Internal Medicine

## 2020-05-01 NOTE — Telephone Encounter (Signed)
-----   Message from Delorise Jackson, MD sent at 05/01/2020  8:29 AM EST ----- Regarding: RE: mammo and [place] Ok we need to sch mammo and [place] please thanks  ----- Message ----- From: Ashley Jacobs Sent: 05/01/2020   8:21 AM EST To: Nino Glow McLean-Scocuzza, MD Subject: mammo and [place]                                   Good morning!  FYI- I have received out to pt:  Esau Grew R 03/29/2020 11:27 AM pt will call [place] and call me back if she cannot get the release signed. Marcelene Butte Rasheedah R 04/06/2020 12:19 PM lft vm for pt to call ofc  Youngblood, Rasheedah R 04/11/2020 9:42 AM lft vm for pt to call ofc Youngblood, Rasheedah R 04/17/2020 10:00 AM LFT vn for pt to call ofc    With no return call from pt. Please advise and Thank you!

## 2020-05-01 NOTE — Telephone Encounter (Signed)
Mail letter to pt needs to sign release for mammogram to be scheduled

## 2020-05-01 NOTE — Telephone Encounter (Signed)
Letter sent.

## 2020-05-01 NOTE — Telephone Encounter (Signed)
I cannot schedule a pt that I cannot contact. I've tried several times to contact the pt.

## 2020-05-07 ENCOUNTER — Encounter: Payer: Self-pay | Admitting: Obstetrics and Gynecology

## 2020-05-07 ENCOUNTER — Other Ambulatory Visit: Payer: Self-pay

## 2020-05-07 ENCOUNTER — Ambulatory Visit (INDEPENDENT_AMBULATORY_CARE_PROVIDER_SITE_OTHER): Payer: BC Managed Care – PPO | Admitting: Obstetrics and Gynecology

## 2020-05-07 VITALS — BP 126/84 | Ht 64.0 in | Wt 273.0 lb

## 2020-05-07 DIAGNOSIS — N939 Abnormal uterine and vaginal bleeding, unspecified: Secondary | ICD-10-CM

## 2020-05-07 DIAGNOSIS — Z1239 Encounter for other screening for malignant neoplasm of breast: Secondary | ICD-10-CM | POA: Diagnosis not present

## 2020-05-07 DIAGNOSIS — Z01419 Encounter for gynecological examination (general) (routine) without abnormal findings: Secondary | ICD-10-CM | POA: Diagnosis not present

## 2020-05-07 NOTE — Progress Notes (Signed)
Gynecology Annual Exam  PCP: McLean-Scocuzza, Nino Glow, MD  Chief Complaint:  Chief Complaint  Patient presents with  . Gynecologic Exam    Annual - c/o menstrual cycle every two weeks w/heavy bleeding. RM 4    History of Present Illness:Patient is a 49 y.o. No obstetric history on file. presents for annual exam. The patient has no complaints today.   LMP: Patient's last menstrual period was 04/23/2020. Regular monthly lasting 5-7 days.  However past few months started alternating heavier cycles followed by repeat cycle 2 weeks later lighter in flow but still around 6 days of flow.  Moderate cramping. Not on any hormonal contraceptives, status post prior BTL.   The patient is sexually active. She denies dyspareunia.  The patient does perform self breast exams.  There is no notable family history of breast or ovarian cancer in her family.  The patient wears seatbelts: yes.   The patient has regular exercise: not asked.    The patient denies current symptoms of depression.     Review of Systems: Review of Systems  Constitutional: Negative for chills and fever.  HENT: Negative for congestion.   Respiratory: Negative for cough and shortness of breath.   Cardiovascular: Negative for chest pain and palpitations.  Gastrointestinal: Negative for abdominal pain, constipation, diarrhea, heartburn, nausea and vomiting.  Genitourinary: Negative for dysuria, frequency and urgency.  Skin: Negative for itching and rash.  Neurological: Negative for dizziness and headaches.  Endo/Heme/Allergies: Negative for polydipsia.  Psychiatric/Behavioral: Negative for depression.    Past Medical History:  Patient Active Problem List   Diagnosis Date Noted  . Iron deficiency 09/07/2019  . Seborrheic dermatitis, unspecified 09/07/2019  . Annual physical exam 02/15/2019  . Unexplained endometrial cells on cervical cytology 02/15/2019  . Hyperlipidemia 09/17/2018  . Morbid obesity with BMI of  45.0-49.9, adult ([place]) 09/17/2018  . Anemia 09/17/2018  . Gastroesophageal reflux disease 09/16/2018  . Vitamin D deficiency 09/16/2018  . Essential hypertension 06/18/2018  . Allergic rhinitis 06/18/2018  . Chronic pain of both knees 06/18/2018    Left knee moderate OA    . Left shoulder pain 06/18/2018    Past Surgical History:  Past Surgical History:  Procedure Laterality Date  . NO PAST SURGERIES      Gynecologic History:  Patient's last menstrual period was 04/23/2020. Last Pap: Results were: 08/23/2018 NIL and HR HPV negative, endometrial cells present Last mammogram: 12/07/2019 Results were: BI-RAD I  Obstetric History: No obstetric history on file.  Family History:  Family History  Problem Relation Age of Onset  . Hypertension Mother   . Hypertension Brother   . Hypertension Maternal Grandmother   . Hypertension Maternal Grandfather     Social History:  Social History   Socioeconomic History  . Marital status: Married    Spouse name: Not on file  . Number of children: Not on file  . Years of education: Not on file  . Highest education level: Not on file  Occupational History  . Not on file  Tobacco Use  . Smoking status: Current Every Day Smoker  . Smokeless tobacco: Never Used  Vaping Use  . Vaping Use: Never used  Substance and Sexual Activity  . Alcohol use: Not Currently  . Drug use: Not Currently  . Sexual activity: Yes    Birth control/protection: Surgical    Comment: tubaligation  Other Topics Concern  . Not on file  Social History Narrative   Divorced    Prefers  women   Smoker    Owns guns    Wears seat belt    Safe in relationship female   Some college    Estate manager/land agent    Social Determinants of Health   Financial Resource Strain: Not on file  Food Insecurity: Not on file  Transportation Needs: Not on file  Physical Activity: Not on file  Stress: Not on file  Social Connections: Not on file  Intimate Partner Violence: Not on  file    Allergies:  No Known Allergies  Medications: Prior to Admission medications   Medication Sig Start Date End Date Taking? Authorizing Provider  amLODipine (NORVASC) 5 MG tablet Take 1 tablet (5 mg total) by mouth daily. 09/07/19   McLean-Scocuzza, Nino Glow, MD  atorvastatin (LIPITOR) 10 MG tablet Take 1 tablet (10 mg total) by mouth daily at 6 PM. At night 09/07/19   McLean-Scocuzza, Nino Glow, MD  cetirizine (ZYRTEC) 10 MG tablet Take 1 tablet (10 mg total) by mouth daily. 09/07/19   McLean-Scocuzza, Nino Glow, MD  Cholecalciferol (DIALYVITE VITAMIN D 5000 PO) Take 5,000 Units by mouth daily.    [provider]  doxycycline (VIBRA-TABS) 100 MG tablet Take 1 tablet (100 mg total) by mouth 2 (two) times daily. With food 03/29/20   McLean-Scocuzza, Nino Glow, MD  famotidine (PEPCID) 20 MG tablet Take 1 tablet (20 mg total) by mouth 2 (two) times daily. In am Patient taking differently: Take 20 mg by mouth 2 (two) times daily. In am, otc generic 09/07/19   McLean-Scocuzza, Nino Glow, MD  Fluocinolone Acetonide Scalp 0.01 % OIL Apply 1-2 x per week prn 1 ounces OR leave on scalp overnight>4 hrs before washing out 03/09/20   McLean-Scocuzza, Nino Glow, MD  hydrocortisone 2.5 % lotion Apply topically 2 (two) times daily. face 09/07/19   McLean-Scocuzza, Nino Glow, MD  ketoconazole (NIZORAL) 2 % shampoo Apply 1 application topically 2 (two) times a week. Let sit 5 minutes 09/07/19   McLean-Scocuzza, Nino Glow, MD  lisinopril-hydrochlorothiazide (ZESTORETIC) 20-12.5 MG tablet Take 2 tablets by mouth daily. 09/28/19   McLean-Scocuzza, Nino Glow, MD  montelukast (SINGULAIR) 10 MG tablet Take 1 tablet (10 mg total) by mouth at bedtime. 09/07/19   McLean-Scocuzza, Nino Glow, MD  mupirocin ointment (BACTROBAN) 2 % Apply 1 application topically 2 (two) times daily. Left breast 03/29/20   McLean-Scocuzza, Nino Glow, MD  OMEGA-3 FATTY ACIDS PO Take by mouth.    [provider]    Physical Exam Vitals: Blood  pressure 126/84, height 5\' 4"  (1.626 m), weight 273 lb (123.8 kg), last menstrual period 04/23/2020.   General: NAD HEENT: normocephalic, anicteric Thyroid: no enlargement, no palpable nodules Pulmonary: No increased work of breathing, CTAB Cardiovascular: RRR, distal pulses 2+ Breast: Breast symmetrical, no tenderness, no palpable nodules or masses, no skin or nipple retraction present, no nipple discharge.  No axillary or supraclavicular lymphadenopathy. Abdomen: NABS, soft, non-tender, non-distended.  Umbilicus without lesions.  No hepatomegaly, splenomegaly or masses palpable. No evidence of hernia  Genitourinary:  External: Normal external female genitalia.  Normal urethral meatus, normal Bartholin's and Skene's glands.    Vagina: Normal vaginal mucosa, no evidence of prolapse.    Cervix: Grossly normal in appearance, no bleeding  Uterus: Non-enlarged, mobile, normal contour.  No CMT  Adnexa: ovaries non-enlarged, no adnexal masses  Rectal: deferred  Lymphatic: no evidence of inguinal lymphadenopathy Extremities: no edema, erythema, or tenderness Neurologic: Grossly intact Psychiatric: mood appropriate, affect full  Female chaperone present for  pelvic and breast  portions of the physical exam   There is no immunization history on file for this patient.   Assessment: 49 y.o. No obstetric history on file. routine annual exam  Plan: Problem List Items Addressed This Visit   None   Visit Diagnoses    Encounter for gynecological examination without abnormal finding    -  Primary   Breast screening       Abnormal uterine bleeding       Relevant Orders   US Transvaginal Non-OB      1) Mammogram - recommend yearly screening mammogram.  Mammogram Is up to date  2) STI screening  was notoffered and therefore not obtained  3) ASCCP guidelines and rational discussed.  Patient opts for every 3 years screening interval  4) Osteoporosis  - per USPTF routine screening DEXA at  age 20  5) Routine healthcare maintenance including cholesterol, diabetes screening discussed managed by PCP  6) Colonoscopy  - pending ordered by PCP  7) AUB - TSH and CBC normal 06/2019.  Will start with evaluation of endometrium and uterus with TVUS.  If no focal abnormalities endometrial biopsy  8) Return in about 1 week (around 05/14/2020) for 1-2 week TVUS and follow up.    Malachy Mood, MD [place], [facility] 05/07/2020, 3:57 PM

## 2020-05-28 ENCOUNTER — Other Ambulatory Visit: Payer: Self-pay

## 2020-05-28 ENCOUNTER — Other Ambulatory Visit: Payer: Self-pay | Admitting: Obstetrics and Gynecology

## 2020-05-28 ENCOUNTER — Encounter: Payer: Self-pay | Admitting: Obstetrics and Gynecology

## 2020-05-28 ENCOUNTER — Ambulatory Visit (INDEPENDENT_AMBULATORY_CARE_PROVIDER_SITE_OTHER): Payer: BC Managed Care – PPO | Admitting: Obstetrics and Gynecology

## 2020-05-28 ENCOUNTER — Ambulatory Visit (INDEPENDENT_AMBULATORY_CARE_PROVIDER_SITE_OTHER): Payer: BC Managed Care – PPO

## 2020-05-28 VITALS — BP 134/78 | Ht 65.0 in | Wt 275.0 lb

## 2020-05-28 DIAGNOSIS — N939 Abnormal uterine and vaginal bleeding, unspecified: Secondary | ICD-10-CM

## 2020-05-28 DIAGNOSIS — D251 Intramural leiomyoma of uterus: Secondary | ICD-10-CM

## 2020-05-28 DIAGNOSIS — D252 Subserosal leiomyoma of uterus: Secondary | ICD-10-CM | POA: Diagnosis not present

## 2020-05-28 NOTE — Progress Notes (Signed)
Gynecology Ultrasound Follow Up  Chief Complaint:  Chief Complaint  Patient presents with  . Follow-up    TVUS F/U - RM 6     History of Present Illness: Patient is a 50 y.o. female who presents today for ultrasound evaluation of AUB.  Ultrasound demonstrates the following findgins Adnexa: one small right hemorrhagic ovarian cyst Uterus: Several small intramural and subserosal fibroids largest measuring 3cm with endometrial stripe 4.70mm Additional: no free fluid  Review of Systems: Review of Systems  Constitutional: Negative.   Gastrointestinal: Negative.   Genitourinary: Negative.     Past Medical History:  Past Medical History:  Diagnosis Date  . Allergy   . Hypertension     Past Surgical History:  Past Surgical History:  Procedure Laterality Date  . NO PAST SURGERIES      Gynecologic History:  No LMP recorded.  Family History:  Family History  Problem Relation Age of Onset  . Hypertension Mother   . Hypertension Brother   . Hypertension Maternal Grandmother   . Hypertension Maternal Grandfather     Social History:  Social History   Socioeconomic History  . Marital status: Married    Spouse name: Not on file  . Number of children: Not on file  . Years of education: Not on file  . Highest education level: Not on file  Occupational History  . Not on file  Tobacco Use  . Smoking status: Current Every Day Smoker  . Smokeless tobacco: Never Used  Vaping Use  . Vaping Use: Never used  Substance and Sexual Activity  . Alcohol use: Not Currently  . Drug use: Not Currently  . Sexual activity: Yes    Birth control/protection: Surgical    Comment: tubaligation  Other Topics Concern  . Not on file  Social History Narrative   Divorced    Prefers women   Smoker    Owns guns    Wears seat belt    Safe in relationship female   Some college    Estate manager/land agent    Social Determinants of Health   Financial Resource Strain: Not on file  Food  Insecurity: Not on file  Transportation Needs: Not on file  Physical Activity: Not on file  Stress: Not on file  Social Connections: Not on file  Intimate Partner Violence: Not on file    Allergies:  No Known Allergies  Medications: Prior to Admission medications   Medication Sig Start Date End Date Taking? Authorizing Provider  amLODipine (NORVASC) 5 MG tablet Take 1 tablet (5 mg total) by mouth daily. 09/07/19  Yes McLean-Scocuzza, Nino Glow, MD  atorvastatin (LIPITOR) 10 MG tablet Take 1 tablet (10 mg total) by mouth daily at 6 PM. At night 09/07/19  Yes McLean-Scocuzza, Nino Glow, MD  cetirizine (ZYRTEC) 10 MG tablet Take 1 tablet (10 mg total) by mouth daily. 09/07/19  Yes McLean-Scocuzza, Nino Glow, MD  Cholecalciferol (DIALYVITE VITAMIN D 5000 PO) Take 5,000 Units by mouth daily.   Yes [provider]  famotidine (PEPCID) 20 MG tablet Take 1 tablet (20 mg total) by mouth 2 (two) times daily. In am Patient taking differently: Take 20 mg by mouth 2 (two) times daily. In am, otc generic 09/07/19  Yes McLean-Scocuzza, Nino Glow, MD  lisinopril-hydrochlorothiazide (ZESTORETIC) 20-12.5 MG tablet Take 2 tablets by mouth daily. 09/28/19  Yes McLean-Scocuzza, Nino Glow, MD  montelukast (SINGULAIR) 10 MG tablet Take 1 tablet (10 mg total) by mouth at bedtime. 09/07/19  Yes McLean-Scocuzza,  Nino Glow, MD  OMEGA-3 FATTY ACIDS PO Take by mouth.   Yes [provider]  doxycycline (VIBRA-TABS) 100 MG tablet Take 1 tablet (100 mg total) by mouth 2 (two) times daily. With food Patient not taking: Reported on 05/07/2020 03/29/20   McLean-Scocuzza, Nino Glow, MD  Fluocinolone Acetonide Scalp 0.01 % OIL Apply 1-2 x per week prn 1 ounces OR leave on scalp overnight>4 hrs before washing out Patient not taking: Reported on 05/28/2020 03/09/20   McLean-Scocuzza, Nino Glow, MD  hydrocortisone 2.5 % lotion Apply topically 2 (two) times daily. face Patient not taking: Reported on 05/28/2020 09/07/19    McLean-Scocuzza, Nino Glow, MD  ketoconazole (NIZORAL) 2 % shampoo Apply 1 application topically 2 (two) times a week. Let sit 5 minutes Patient not taking: Reported on 05/28/2020 09/07/19   McLean-Scocuzza, Nino Glow, MD  mupirocin ointment (BACTROBAN) 2 % Apply 1 application topically 2 (two) times daily. Left breast Patient not taking: Reported on 05/07/2020 03/29/20   McLean-Scocuzza, Nino Glow, MD    Physical Exam Vitals: Blood pressure 134/78, height 5\' 5"  (1.651 m), weight 275 lb (124.7 kg).  General: NAD HEENT: normocephalic, anicteric Pulmonary: No increased work of breathing Extremities: no edema, erythema, or tenderness Neurologic: Grossly intact, normal gait Psychiatric: mood appropriate, affect full   Assessment: 50 y.o. with AUB-L  Plan: Problem List Items Addressed This Visit      Genitourinary   Intramural and subserous leiomyoma of uterus    Other Visit Diagnoses    Abnormal uterine bleeding    -  Primary      1) AUB - AUB-L based on ultrasound findings today.  Discussed management options including oral progestin, depo provera, Mirena IUD, endometrial ablation, or hysterectomy.  Size to small to consider [place].  At present patient most interested in IUD.  Will plan on IUD insertion with next cycle. Given handout on IUD  2) A total of 15 minutes were spent in face-to-face contact with the patient during this encounter with over half of that time devoted to counseling and coordination of care.  3) Return call with next menses for Mirena insertion.    Malachy Mood, MD, [facility], [facility] 05/28/2020, 5:25 PM

## 2020-05-30 ENCOUNTER — Telehealth: Payer: Self-pay | Admitting: Internal Medicine

## 2020-05-30 NOTE — Telephone Encounter (Signed)
Patient is not understanding where Dr. Aundra Dubin wants to send her for her breast extensive breast . Please call patient.

## 2020-05-31 NOTE — Telephone Encounter (Signed)
Called and spoke with the patient. She was confused about the process of getting her records to [place].   Informed the Patient that she would need to sign a release form at [facility]. Informed her that she would then let them know to send this to [place]. [facility] will then send the records to [place]. Once this is done then [facility] will schedule her for imaging.   Patient states that she works the same hours [employer] is open. Gave her their number to call to see if they will fax/e-mail her a form to sign and drop off.   Patient verbalized understanding and will call them.

## 2020-05-31 NOTE — Telephone Encounter (Signed)
Patient needing to call [place] to go sign a release in order to be scheduled. Will call Patient to inform her.

## 2020-06-05 ENCOUNTER — Telehealth: Payer: Self-pay | Admitting: Obstetrics and Gynecology

## 2020-06-05 NOTE — Telephone Encounter (Signed)
Pt will need Mirena IUD ON 06/07/20 10;30 am in our  [facility] with AMS.

## 2020-06-07 ENCOUNTER — Ambulatory Visit (INDEPENDENT_AMBULATORY_CARE_PROVIDER_SITE_OTHER): Payer: BC Managed Care – PPO | Admitting: Obstetrics and Gynecology

## 2020-06-07 ENCOUNTER — Other Ambulatory Visit: Payer: Self-pay

## 2020-06-07 ENCOUNTER — Other Ambulatory Visit (HOSPITAL_COMMUNITY)
Admission: RE | Admit: 2020-06-07 | Discharge: 2020-06-07 | Disposition: A | Discharge status: Home / Self Care | Payer: BC Managed Care – PPO | Source: Ambulatory Visit | Attending: Obstetrics and Gynecology | Admitting: Obstetrics and Gynecology

## 2020-06-07 ENCOUNTER — Encounter: Payer: Self-pay | Admitting: Obstetrics and Gynecology

## 2020-06-07 VITALS — BP 116/75 | Ht 65.0 in | Wt 272.0 lb

## 2020-06-07 DIAGNOSIS — N939 Abnormal uterine and vaginal bleeding, unspecified: Secondary | ICD-10-CM

## 2020-06-07 DIAGNOSIS — Z30430 Encounter for insertion of intrauterine contraceptive device: Secondary | ICD-10-CM

## 2020-06-07 NOTE — Progress Notes (Signed)
   GYNECOLOGY OFFICE PROCEDURE NOTE  Sally Harmon is a 50 y.o. a Mirena IUD insertion. No GYN concerns.  Last pap smear was on 08/23/2018 and was normal.  The patient is currently using Mirena for cycle control and her LMP is Patient's last menstrual period was 06/02/2020..  The indication for her IUD is contraception/cycle control.  IUD Insertion Procedure Note Patient identified, informed consent performed, consent signed.   Discussed risks of irregular bleeding, cramping, infection, malpositioning, expulsion or uterine perforation of the IUD (1:1000 placements)  which may require further procedure such as laparoscopy.  IUD while effective at preventing pregnancy do not prevent transmission of sexually transmitted diseases and use of barrier methods for this purpose was discussed. Time out was performed.  Urine pregnancy test negative.  Speculum placed in the vagina.  Cervix visualized.  Cleaned with Betadine x 2.  Grasped anteriorly with a single tooth tenaculum.  Uterus sounded to 7 cm using a [place]. A endometrial biopsy was obtained during the sounding given AUB and patient's age. IUD placed per manufacturer's recommendations.  Strings trimmed to 3 cm. Tenaculum was removed, good hemostasis noted.  Patient tolerated procedure well.   Patient was given post-procedure instructions.  She was advised to have backup contraception for one week.  Patient was also asked to check IUD strings periodically and follow up in 6 weeks for IUD check.  Malachy Mood, MD, [facility], [place]

## 2020-06-08 LAB — SURGICAL PATHOLOGY

## 2020-06-14 NOTE — Telephone Encounter (Signed)
Mirena rcvd/charged 06/07/20

## 2020-07-20 ENCOUNTER — Encounter: Payer: Self-pay | Admitting: Obstetrics and Gynecology

## 2020-07-20 ENCOUNTER — Other Ambulatory Visit: Payer: Self-pay

## 2020-07-20 ENCOUNTER — Ambulatory Visit (INDEPENDENT_AMBULATORY_CARE_PROVIDER_SITE_OTHER): Payer: BC Managed Care – PPO | Admitting: Obstetrics and Gynecology

## 2020-07-20 VITALS — BP 134/86 | Wt 274.0 lb

## 2020-07-20 DIAGNOSIS — Z30431 Encounter for routine checking of intrauterine contraceptive device: Secondary | ICD-10-CM | POA: Diagnosis not present

## 2020-07-20 NOTE — Progress Notes (Signed)
Obstetrics & Gynecology Office Visit   Chief Complaint:  Chief Complaint  Patient presents with  . Follow-up    F/U IUD string check, no concerns. RM 4    History of Present Illness: 50 y.o. patient presenting for follow up of Mirena IUD placement 6+ weeks ago.  The indication for her IUD was cycle control.  She denies any complications since her IUD placement.  Still having some occasional spotting.  is able to feel strings.    Review of Systems: Review of Systems  Constitutional: Negative.   Gastrointestinal: Negative for abdominal pain.  Genitourinary: Negative.     Past Medical History:  Past Medical History:  Diagnosis Date  . Allergy   . Hypertension     Past Surgical History:  Past Surgical History:  Procedure Laterality Date  . NO PAST SURGERIES      Gynecologic History: No LMP recorded. (Menstrual status: IUD).  Obstetric History: No obstetric history on file.  Family History:  Family History  Problem Relation Age of Onset  . Hypertension Mother   . Hypertension Brother   . Hypertension Maternal Grandmother   . Hypertension Maternal Grandfather     Social History:  Social History   Socioeconomic History  . Marital status: Significant Other    Spouse name: Not on file  . Number of children: Not on file  . Years of education: Not on file  . Highest education level: Not on file  Occupational History  . Not on file  Tobacco Use  . Smoking status: Current Every Day Smoker  . Smokeless tobacco: Never Used  Vaping Use  . Vaping Use: Never used  Substance and Sexual Activity  . Alcohol use: Not Currently  . Drug use: Not Currently  . Sexual activity: Yes    Birth control/protection: Surgical, I.U.D.    Comment: tubaligation  Other Topics Concern  . Not on file  Social History Narrative   Divorced    Prefers women   Smoker    Owns guns    Wears seat belt    Safe in relationship female   Some college    Estate manager/land agent    Social  Determinants of Health   Financial Resource Strain: Not on file  Food Insecurity: Not on file  Transportation Needs: Not on file  Physical Activity: Not on file  Stress: Not on file  Social Connections: Not on file  Intimate Partner Violence: Not on file    Allergies:  No Known Allergies  Medications: Prior to Admission medications   Medication Sig Start Date End Date Taking? Authorizing Provider  amLODipine (NORVASC) 5 MG tablet Take 1 tablet (5 mg total) by mouth daily. 09/07/19  Yes McLean-Scocuzza, Nino Glow, MD  atorvastatin (LIPITOR) 10 MG tablet Take 1 tablet (10 mg total) by mouth daily at 6 PM. At night 09/07/19  Yes McLean-Scocuzza, Nino Glow, MD  cetirizine (ZYRTEC) 10 MG tablet Take 1 tablet (10 mg total) by mouth daily. 09/07/19  Yes McLean-Scocuzza, Nino Glow, MD  Cholecalciferol (DIALYVITE VITAMIN D 5000 PO) Take 5,000 Units by mouth daily.   Yes [provider]  lisinopril-hydrochlorothiazide (ZESTORETIC) 20-12.5 MG tablet Take 2 tablets by mouth daily. 09/28/19  Yes McLean-Scocuzza, Nino Glow, MD  montelukast (SINGULAIR) 10 MG tablet Take 1 tablet (10 mg total) by mouth at bedtime. 09/07/19  Yes McLean-Scocuzza, Nino Glow, MD  OMEGA-3 FATTY ACIDS PO Take by mouth.   Yes [provider]    Physical Exam Blood  pressure 134/86, weight 274 lb (124.3 kg). No LMP recorded. (Menstrual status: IUD).  General: NAD HEENT: normocephalic, anicteric Pulmonary: No increased work of breathing Genitourinary:  External: Normal external female genitalia.  Normal urethral meatus, normal Bartholin's and Skene's glands.    Vagina: Normal vaginal mucosa, no evidence of prolapse.    Cervix: Grossly normal in appearance, no bleeding, IUD strings visualized 2cm  Uterus: Non-enlarged, mobile, normal contour.  No CMT  Adnexa: ovaries non-enlarged, no adnexal masses  Rectal: deferred  Lymphatic: no evidence of inguinal lymphadenopathy Extremities: no edema, erythema, or  tenderness Neurologic: Grossly intact Psychiatric: mood appropriate, affect full  Female chaperone present for pelvic and breast  portions of the physical exam  Assessment: 50 y.o. follow IUD string check   Plan: Problem List Items Addressed This Visit   None   Visit Diagnoses    IUD check up    -  Primary      1.  The patient was given instructions to check her IUD strings monthly and call with any problems or concerns.  She should call for fevers, chills, abnormal vaginal discharge, pelvic pain, or other complaints.  2.   IUDs while effective at preventing pregnancy do not prevent transmission of sexually transmitted diseases and use of barrier methods for this purpose was discussed.  Low overall incidence of failure with 99.7% efficacy rate in typical use.  The patient has not contraindication to IUD placement.  3.  She will return for a annual exam in 1 year.  All questions answered.  4) A total of 15 minutes were spent in face-to-face contact with the patient during this encounter with over half of that time devoted to counseling and coordination of care.  5) Return in about 1 year (around 07/20/2021) for annual.   Malachy Mood, MD, [place], [place] 07/20/2020, 9:05 AM

## 2020-09-07 ENCOUNTER — Encounter: Payer: Self-pay | Admitting: Internal Medicine

## 2020-09-07 ENCOUNTER — Other Ambulatory Visit: Payer: Self-pay

## 2020-09-07 ENCOUNTER — Other Ambulatory Visit: Payer: Self-pay | Admitting: Internal Medicine

## 2020-09-07 ENCOUNTER — Ambulatory Visit (INDEPENDENT_AMBULATORY_CARE_PROVIDER_SITE_OTHER): Payer: BC Managed Care – PPO | Admitting: Internal Medicine

## 2020-09-07 VITALS — BP 118/74 | HR 88 | Temp 97.8°F | Ht 65.0 in | Wt 276.8 lb

## 2020-09-07 DIAGNOSIS — Z1211 Encounter for screening for malignant neoplasm of colon: Secondary | ICD-10-CM | POA: Diagnosis not present

## 2020-09-07 DIAGNOSIS — R202 Paresthesia of skin: Secondary | ICD-10-CM

## 2020-09-07 DIAGNOSIS — L219 Seborrheic dermatitis, unspecified: Secondary | ICD-10-CM

## 2020-09-07 DIAGNOSIS — R200 Anesthesia of skin: Secondary | ICD-10-CM

## 2020-09-07 DIAGNOSIS — K219 Gastro-esophageal reflux disease without esophagitis: Secondary | ICD-10-CM

## 2020-09-07 DIAGNOSIS — Z1389 Encounter for screening for other disorder: Secondary | ICD-10-CM

## 2020-09-07 DIAGNOSIS — Z1329 Encounter for screening for other suspected endocrine disorder: Secondary | ICD-10-CM | POA: Diagnosis not present

## 2020-09-07 MED ORDER — LANSOPRAZOLE 30 MG PO CPDR: 30.0000 mg | DELAYED_RELEASE_CAPSULE | Freq: Every day | ORAL | 3 refills | Status: DC

## 2020-09-07 MED ORDER — FLUOCINOLONE ACETONIDE SCALP 0.01 % EX OIL: TOPICAL_OIL | CUTANEOUS | 11 refills | Status: DC

## 2020-09-07 MED ORDER — KETOCONAZOLE 2 % EX SHAM: 1.0000 "application " | MEDICATED_SHAMPOO | CUTANEOUS | 11 refills | Status: DC

## 2020-09-07 NOTE — Patient Instructions (Addendum)
Dr. Amede[facility] Carpal tunnel wrist brace  Consider pfizer booster 10/31/20   Mammogram due 12/06/20     Food Choices for Gastroesophageal Reflux Disease, Adult When you have gastroesophageal reflux disease (GERD), the foods you eat and your eating habits are very important. Choosing the right foods can help ease the discomfort of GERD. Consider working with a dietitian to help you make healthy food choices. What are tips for following this plan? Reading food labels  Look for foods that are low in saturated fat. Foods that have less than 5% of daily value (DV) of fat and 0 g of trans fats may help with your symptoms. Cooking  Cook foods using methods other than frying. This may include baking, steaming, grilling, or broiling. These are all methods that do not need a lot of fat for cooking.  To add flavor, try to use herbs that are low in spice and acidity. Meal planning  Choose healthy foods that are low in fat, such as fruits, vegetables, whole grains, low-fat dairy products, lean meats, fish, and poultry.  Eat frequent, small meals instead of three large meals each day. Eat your meals slowly, in a relaxed setting. Avoid bending over or lying down until 2-3 hours after eating.  Limit high-fat foods such as fatty meats or fried foods.  Limit your intake of fatty foods, such as oils, butter, and shortening.  Avoid the following as told by your health care provider: ? Foods that cause symptoms. These may be different for different people. Keep a food diary to keep track of foods that cause symptoms. ? Alcohol. ? Drinking large amounts of liquid with meals. ? Eating meals during the 2-3 hours before bed.   Lifestyle  Maintain a healthy weight. Ask your health care provider what weight is healthy for you. If you need to lose weight, work with your health care provider to do so safely.  Exercise for at least 30 minutes on 5 or more days each week, or as told by your health  care provider.  Avoid wearing clothes that fit tightly around your waist and chest.  Do not use any products that contain nicotine or tobacco. These products include cigarettes, chewing tobacco, and vaping devices, such as e-cigarettes. If you need help quitting, ask your health care provider.  Sleep with the head of your bed raised. Use a wedge under the mattress or blocks under the bed frame to raise the head of the bed.  Chew sugar-free gum after mealtimes. What foods should I eat? Eat a healthy, well-balanced diet of fruits, vegetables, whole grains, low-fat dairy products, lean meats, fish, and poultry. Each person is different. Foods that may trigger symptoms in one person may not trigger any symptoms in another person. Work with your health care provider to identify foods that are safe for you. The items listed above may not be a complete list of recommended foods and beverages. Contact a dietitian for more information.   What foods should I avoid? Limiting some of these foods may help manage the symptoms of GERD. Everyone is different. Consult a dietitian or your health care provider to help you identify the exact foods to avoid, if any. Fruits Any fruits prepared with added fat. Any fruits that cause symptoms. For some people this may include citrus fruits, such as oranges, grapefruit, pineapple, and lemons. Vegetables Deep-fried vegetables. [place] fries. Any vegetables prepared with added fat. Any vegetables that cause symptoms. For some people, this may include tomatoes and  tomato products, chili peppers, onions and garlic, and horseradish. Grains Pastries or quick breads with added fat. Meats and other proteins High-fat meats, such as fatty beef or pork, hot dogs, ribs, ham, sausage, salami, and bacon. Fried meat or protein, including fried fish and fried chicken. Nuts and nut butters, in large amounts. Dairy Whole milk and chocolate milk. Sour cream. Cream. Ice cream. Cream  cheese. Milkshakes. Fats and oils Butter. Margarine. Shortening. Ghee. Beverages Coffee and tea, with or without caffeine. Carbonated beverages. Sodas. Energy drinks. Fruit juice made with acidic fruits, such as orange or grapefruit. Tomato juice. Alcoholic drinks. Sweets and desserts Chocolate and cocoa. Donuts. Seasonings and condiments Pepper. Peppermint and spearmint. Added salt. Any condiments, herbs, or seasonings that cause symptoms. For some people, this may include curry, hot sauce, or vinegar-based salad dressings. The items listed above may not be a complete list of foods and beverages to avoid. Contact a dietitian for more information. Questions to ask your health care provider Diet and lifestyle changes are usually the first steps that are taken to manage symptoms of GERD. If diet and lifestyle changes do not improve your symptoms, talk with your health care provider about taking medicines. Where to find more information  International Foundation for Gastrointestinal Disorders: aboutgerd.org Summary  When you have gastroesophageal reflux disease (GERD), food and lifestyle choices may be very helpful in easing the discomfort of GERD.  Eat frequent, small meals instead of three large meals each day. Eat your meals slowly, in a relaxed setting. Avoid bending over or lying down until 2-3 hours after eating.  Limit high-fat foods such as fatty meats or fried foods. This information is not intended to replace advice given to you by your health care provider. Make sure you discuss any questions you have with your health care provider. Document Revised: 11/14/2019 Document Reviewed: 11/14/2019 Elsevier Patient Education  2021 [place].  Preventing Carpal Tunnel Syndrome  Carpal tunnel syndrome is a condition that causes pain, numbness, and weakness in the wrist, hand, and fingers. The carpal tunnel is a narrow, rigid space in the wrist. Tendons and the median nerve pass through  the carpal tunnel. The median nerve is the main nerve in the hand. The median nerve gives sensation or feeling to the thumb, the muscles at the base of the thumb, and the first three fingers. Carpal tunnel syndrome happens when the median nerve gets squeezed in the area where it passes through the carpal tunnel. In some cases, it may not be possible to prevent carpal tunnel syndrome. However, you can take steps to relieve pressure on your wrist and reduce your risk of developing this condition. How can carpal tunnel syndrome affect me? Carpal tunnel syndrome can affect your ability to do jobs or activities that involve hand, wrist, and finger action. It can cause symptoms such as:  Pain in the wrist, hand, and fingers.  Burning, tingling, or numbness in the affected area.  A weak feeling in your hands. You may have trouble grabbing and holding items. Symptoms may get worse over time. For some people, symptoms get worse at night. What can increase my risk? The following factors may make you more likely to develop this condition:  Having a job that requires you to repeatedly or forcefully bend or move your wrist, or a job that requires you to use tools that vibrate. This may include jobs that involve using computers, working on an [employer], or working with power tools such as drills or  sanders.  Being a woman.  Having a family history of the condition.  Having certain conditions, such as: ? Diabetes. ? Pregnancy. ? Obesity. ? Thyroid disease. ? Rheumatoid arthritis. What actions can I take to help prevent carpal tunnel syndrome?  Avoid making repetitive or forceful hand and wrist motions that cause your wrist to bend or get stiff or painful.  Take frequent breaks, about every 30 minutes, if you use your hands and wrists for many hours at a time.  Avoid sitting for long stretches of time. Try to get up and move every 30 minutes. Stretch your hands and fingers often to increase blood  flow and relieve tension.  Keep your wrists in the natural position when using a computer keyboard or mouse. Do not bend your wrists downward or sideways. Arms and shoulders should be relaxed with elbows at your sides.  If you use your hands and wrists for many hours at work, make changes to your work space to ease pressure on your wrists. You may want to use: ? A padded wrist rest for computer work. Use this to lightly rest your wrist and hands when you are not actively keying. ? A keyboard at a height in which your wrists are straight when typing. You may need to flatten the keyboard or even tilt it away from you. ? Hand tools with padded handles or work gloves with padding to reduce vibrations.  Talk to your health provider about wearing a wrist brace or support. This will not prevent carpal tunnel syndrome but it may keep it from getting worse. A wrist brace may help reduce bending and stress.  Closely manage any medical conditions you have that can put you at risk for carpal tunnel syndrome. Have your blood sugar checked to make sure you are not developing diabetes. If you have diabetes, work with your health care provider to keep your blood sugar under control.  Physical activity and exercise may help with this condition. Some people find yoga or aerobic exercise helpful.      Where to find more information  [employer] of Neurological Disorders and Stroke: [employer]  [place] [facility]: Patent attorney.org Contact a health care provider if:  You have numbness or tingling in your wrist, hand, or fingers.  You have pain or a burning sensation in your wrist, hand, or fingers.  Pain, tingling, or burning wakes you up at night.  Your hand becomes weak and clumsy.  You frequently drop objects.  You are unable to use your wrists and hands without pain. Summary  Carpal tunnel syndrome is a condition that causes pain, numbness, and weakness in the wrist,  hand, and fingers.  You can take steps to relieve pressure on your wrist and reduce your risk of developing this condition.  Avoid making repetitive hand and wrist motions that cause your wrist to get stiff or painful.  If you use your hands and wrists for many hours at work, you may want to make changes to your work space to ease pressure on your wrists.  Take frequent breaks to stretch your hands and fingers. This information is not intended to replace advice given to you by your health care provider. Make sure you discuss any questions you have with your health care provider. Document Revised: 10/20/2019 Document Reviewed: 09/15/2019 Elsevier Patient Education  2021 [place].  Carpal Tunnel Syndrome  Carpal tunnel syndrome is a condition that causes pain, numbness, and weakness in your hand and fingers. The  carpal tunnel is a narrow area located on the palm side of your wrist. Repeated wrist motion or certain diseases may cause swelling within the tunnel. This swelling pinches the main nerve in the wrist. The main nerve in the wrist is called the median nerve. What are the causes? This condition may be caused by:  Repeated and forceful wrist and hand motions.  Wrist injuries.  Arthritis.  A cyst or tumor in the carpal tunnel.  Fluid buildup during pregnancy.  Use of tools that vibrate. Sometimes the cause of this condition is not known. What increases the risk? The following factors may make you more likely to develop this condition:  Having a job that requires you to repeatedly or forcefully move your wrist or hand or requires you to use tools that vibrate. This may include jobs that involve using computers, working on an [employer], or working with [place] such as Pension scheme manager.  Being a woman.  Having certain conditions, such as: ? Diabetes. ? Obesity. ? An underactive thyroid (hypothyroidism). ? Kidney failure. ? Rheumatoid arthritis. What are the  signs or symptoms? Symptoms of this condition include:  A tingling feeling in your fingers, especially in your thumb, index, and middle fingers.  Tingling or numbness in your hand.  An aching feeling in your entire arm, especially when your wrist and elbow are bent for a long time.  Wrist pain that goes up your arm to your shoulder.  Pain that goes down into your palm or fingers.  A weak feeling in your hands. You may have trouble grabbing and holding items. Your symptoms may feel worse during the night. How is this diagnosed? This condition is diagnosed with a medical history and physical exam. You may also have tests, including:  Electromyogram (EMG). This test measures electrical signals sent by your nerves into the muscles.  Nerve conduction study. This test measures how well electrical signals pass through your nerves.  Imaging tests, such as X-rays, ultrasound, and MRI. These tests check for possible causes of your condition. How is this treated? This condition may be treated with:  Lifestyle changes. It is important to stop or change the activity that caused your condition.  Doing exercise and activities to strengthen and stretch your muscles and tendons (physical therapy).  Making lifestyle changes to help with your condition and learning how to do your daily activities safely (occupational therapy).  Medicines for pain and inflammation. This may include medicine that is injected into your wrist.  A wrist splint or brace.  Surgery. Follow these instructions at home: If you have a splint or brace:  Wear the splint or brace as told by your health care provider. Remove it only as told by your health care provider.  Loosen the splint or brace if your fingers tingle, become numb, or turn cold and blue.  Keep the splint or brace clean.  If the splint or brace is not waterproof: ? Do not let it get wet. ? Cover it with a watertight covering when you take a bath or  shower. Managing pain, stiffness, and swelling If directed, put ice on the painful area. To do this:  If you have a removeable splint or brace, remove it as told by your health care provider.  Put ice in a plastic bag.  Place a towel between your skin and the bag or between the splint or brace and the bag.  Leave the ice on for 20 minutes, 2-3 times a day.  Do not fall asleep with the cold pack on your skin.  Remove the ice if your skin turns bright red. This is very important. If you cannot feel pain, heat, or cold, you have a greater risk of damage to the area. Move your fingers often to reduce stiffness and swelling.   General instructions  Take over-the-counter and prescription medicines only as told by your health care provider.  Rest your wrist and hand from any activity that may be causing your pain. If your condition is work related, talk with your employer about changes that can be made, such as getting a wrist pad to use while typing.  Do any exercises as told by your health care provider, physical therapist, or occupational therapist.  Keep all follow-up visits. This is important. Contact a health care provider if:  You have new symptoms.  Your pain is not controlled with medicines.  Your symptoms get worse. Get help right away if:  You have severe numbness or tingling in your wrist or hand. Summary  Carpal tunnel syndrome is a condition that causes pain, numbness, and weakness in your hand and fingers.  It is usually caused by repeated wrist motions.  Lifestyle changes and medicines are used to treat carpal tunnel syndrome. Surgery may be recommended.  Follow your health care provider's instructions about wearing a splint, resting from activity, keeping follow-up visits, and calling for help. This information is not intended to replace advice given to you by your health care provider. Make sure you discuss any questions you have with your health care  provider. Document Revised: 09/15/2019 Document Reviewed: 09/15/2019 Elsevier Patient Education  [place].

## 2020-09-07 NOTE — Progress Notes (Signed)
Chief Complaint  Patient presents with  . Follow-up  . Hand Problem    Numbness and tingling in the right hand   F/u  1. 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 GERD needs refill of prevacid did work  3. htn on norvasc 5 mg qd and lis 20-hctz 12.5 mg qd controlled still exercising to lose wt  4. Dry itching scalp around the edges she just got a hair treatment on her dreadlocks   Review of Systems  Constitutional: Negative for weight loss.  HENT: Negative for hearing loss.   Eyes: Negative for blurred vision.  Respiratory: Negative for shortness of breath.   Cardiovascular: Negative for chest pain.  Gastrointestinal: Negative for abdominal pain.  Musculoskeletal: Positive for joint pain.  Skin: Positive for itching and rash.  Neurological: Positive for sensory change.  Psychiatric/Behavioral: Negative for depression.   Past Medical History:  Diagnosis Date  . Allergy   . Hypertension    Past Surgical History:  Procedure Laterality Date  . NO PAST SURGERIES     Family History  Problem Relation Age of Onset  . Hypertension Mother   . Hypertension Brother   . Hypertension Maternal Grandmother   . Hypertension Maternal Grandfather    Social History   Socioeconomic History  . Marital status: Significant Other    Spouse name: Not on file  . Number of children: Not on file  . Years of education: Not on file  . Highest education level: Not on file  Occupational History  . Not on file  Tobacco Use  . Smoking status: Current Every Day Smoker  . Smokeless tobacco: Never Used  Vaping Use  . Vaping Use: Never used  Substance and Sexual Activity  . Alcohol use: Not Currently  . Drug use: Not Currently  . Sexual activity: Yes    Birth control/protection: Surgical, I.U.D.    Comment: tubaligation  Other Topics Concern  . Not on file  Social History Narrative    Divorced    Prefers women   Smoker    Owns guns    Wears seat belt    Safe in relationship female   Some college    Estate manager/land agent    Social Determinants of Health   Financial Resource Strain: Not on file  Food Insecurity: Not on file  Transportation Needs: Not on file  Physical Activity: Not on file  Stress: Not on file  Social Connections: Not on file  Intimate Partner Violence: Not on file   Current Meds  Medication Sig  . amLODipine (NORVASC) 5 MG tablet Take 1 tablet (5 mg total) by mouth daily.  Marland Kitchen atorvastatin (LIPITOR) 10 MG tablet Take 1 tablet (10 mg total) by mouth daily at 6 PM. At night  . cetirizine (ZYRTEC) 10 MG tablet Take 1 tablet (10 mg total) by mouth daily.  . Cholecalciferol (DIALYVITE VITAMIN D 5000 PO) Take 5,000 Units by mouth daily.  . Fluocinolone Acetonide Scalp 0.01 % OIL APPLY 1 OUNCE 1 TO 2 TIMES PER WEEK AS NEEDED OR LEAVE ON SCALP OVERNIGHT MORE THAN 4 HOURS BEFORE WASHING OUT  . ketoconazole (NIZORAL) 2 % shampoo Apply 1 application topically 2 (two) times a week.  . lansoprazole (PREVACID) 30 MG capsule Take 1 capsule (30 mg total) by mouth daily. 30 min before food  . lisinopril-hydrochlorothiazide (ZESTORETIC) 20-12.5 MG tablet Take 2  tablets by mouth daily.  . montelukast (SINGULAIR) 10 MG tablet Take 1 tablet (10 mg total) by mouth at bedtime.  . OMEGA-3 FATTY ACIDS PO Take by mouth.   No Known Allergies No results found for this or any previous visit (from the past 2160 hour(s)). Objective  Body mass index is 46.06 kg/m. Wt Readings from Last 3 Encounters:  09/07/20 276 lb 12.8 oz (125.6 kg)  07/20/20 274 lb (124.3 kg)  06/07/20 272 lb (123.4 kg)   Temp Readings from Last 3 Encounters:  09/07/20 97.8 F (36.6 C) (Oral)  03/29/20 97.7 F (36.5 C) (Oral)  03/09/20 98.3 F (36.8 C) (Oral)   BP Readings from Last 3 Encounters:  09/07/20 118/74  07/20/20 134/86  06/07/20 116/75   Pulse Readings from Last 3 Encounters:   09/07/20 88  03/29/20 85  03/09/20 86    Physical Exam Vitals and nursing note reviewed.  Constitutional:      Appearance: Normal appearance. She is well-developed and well-groomed. She is morbidly obese.  HENT:     Head: Normocephalic and atraumatic.  Cardiovascular:     Rate and Rhythm: Normal rate and regular rhythm.     Heart sounds: Normal heart sounds. No murmur heard.   Pulmonary:     Effort: Pulmonary effort is normal.     Breath sounds: Normal breath sounds.  Musculoskeletal:       Arms:  Skin:    General: Skin is warm and dry.  Neurological:     General: No focal deficit present.     Mental Status: She is alert and oriented to person, place, and time. Mental status is at baseline.     Gait: Gait normal.  Psychiatric:        Attention and Perception: Attention and perception normal.        Mood and Affect: Mood and affect normal.        Speech: Speech normal.        Behavior: Behavior normal. Behavior is cooperative.        Thought Content: Thought content normal.        Cognition and Memory: Cognition and memory normal.        Judgment: Judgment normal.     Assessment  Plan  Numbness and tingling in right hand likely CTS disc brace otc wrist brace for cTS - Plan: Ambulatory referral to Orthopedic Surgery Dr. Amedeo Plenty    Seborrheic dermatitis - Plan: ketoconazole (NIZORAL) 2 % shampoo, Fluocinolone Acetonide Scalp 0.01 % OIL  Gastroesophageal reflux disease without esophagitis - Plan: lansoprazole (PREVACID) 30 MG capsule  Annual physical exam due 02/2021  Labs from 02/2020 pt needs to get at work asap fasting add on tsh and UA  Declines flu shot  covid 2/2 consider 3rd dose in 5-6 months Tdap had 06/08/14 ImmuneMMR, hep Bqual + consider quant in future [facility] labs done 06/30/19 ordered 02/2020 not done  Declines STD check Negmammo due7/22/20, 8/7/2020[facility]  Left breast lesion resolved not need dx mammo as of 09/07/19 mammo 12/07/19 negative  [facility] at [facility]  Pap 08/23/2018 [facility] neg pap neg HPV due in 3 yearsbut addendum stated + endometrial cells will refer back to ob/gynjust finished cycle at this time ob/gyn rec f/u in 1 year  -[facility] app[facility] sch 08/24/19 Dr. Leonides Schanz -seeing Dr. Georgianne Fick [facility] +IUD as of 07/2020   Colonoscopy referred [facility]   Smoking cut back rec stop pt has reduced   rec mvt with iron  Provider: Dr. Olivia Mackie McLean-Scocuzza-Internal Medicine

## 2020-09-25 ENCOUNTER — Other Ambulatory Visit: Payer: Self-pay | Admitting: Internal Medicine

## 2020-09-25 DIAGNOSIS — J309 Allergic rhinitis, unspecified: Secondary | ICD-10-CM

## 2020-09-26 ENCOUNTER — Other Ambulatory Visit: Payer: Self-pay | Admitting: Internal Medicine

## 2020-09-26 DIAGNOSIS — I10 Essential (primary) hypertension: Secondary | ICD-10-CM

## 2020-10-22 DIAGNOSIS — Z20822 Contact with and (suspected) exposure to covid-19: Secondary | ICD-10-CM | POA: Diagnosis not present

## 2020-10-22 DIAGNOSIS — Z03818 Encounter for observation for suspected exposure to other biological agents ruled out: Secondary | ICD-10-CM | POA: Diagnosis not present

## 2020-10-25 ENCOUNTER — Other Ambulatory Visit: Payer: Self-pay | Admitting: Internal Medicine

## 2020-10-25 DIAGNOSIS — I10 Essential (primary) hypertension: Secondary | ICD-10-CM

## 2020-10-25 DIAGNOSIS — Z20822 Contact with and (suspected) exposure to covid-19: Secondary | ICD-10-CM | POA: Diagnosis not present

## 2020-10-25 DIAGNOSIS — Z03818 Encounter for observation for suspected exposure to other biological agents ruled out: Secondary | ICD-10-CM | POA: Diagnosis not present

## 2021-03-13 ENCOUNTER — Ambulatory Visit: Payer: BC Managed Care – PPO | Admitting: Internal Medicine

## 2021-04-04 ENCOUNTER — Other Ambulatory Visit: Payer: Self-pay

## 2021-04-04 DIAGNOSIS — E611 Iron deficiency: Secondary | ICD-10-CM

## 2021-04-04 DIAGNOSIS — Z0184 Encounter for antibody response examination: Secondary | ICD-10-CM

## 2021-04-04 DIAGNOSIS — Z1389 Encounter for screening for other disorder: Secondary | ICD-10-CM

## 2021-04-04 DIAGNOSIS — I10 Essential (primary) hypertension: Secondary | ICD-10-CM

## 2021-04-04 DIAGNOSIS — E559 Vitamin D deficiency, unspecified: Secondary | ICD-10-CM

## 2021-04-04 DIAGNOSIS — Z1329 Encounter for screening for other suspected endocrine disorder: Secondary | ICD-10-CM

## 2021-04-04 DIAGNOSIS — E785 Hyperlipidemia, unspecified: Secondary | ICD-10-CM

## 2021-05-01 ENCOUNTER — Telehealth: Payer: Self-pay | Admitting: Internal Medicine

## 2021-05-01 ENCOUNTER — Ambulatory Visit: Payer: BC Managed Care – PPO | Admitting: Internal Medicine

## 2021-05-01 NOTE — Telephone Encounter (Signed)
Patient no-showed today's appointment; appointment was for 05/01/21, provider notified for review of record. Letter sent for patient to call in and re-schedule.

## 2021-05-16 ENCOUNTER — Other Ambulatory Visit: Payer: Self-pay | Admitting: Internal Medicine

## 2021-05-16 DIAGNOSIS — I10 Essential (primary) hypertension: Secondary | ICD-10-CM

## 2021-07-25 ENCOUNTER — Ambulatory Visit: Payer: BC Managed Care – PPO | Admitting: Internal Medicine

## 2021-07-30 ENCOUNTER — Other Ambulatory Visit: Payer: Self-pay | Admitting: Internal Medicine

## 2021-07-30 ENCOUNTER — Encounter: Payer: Self-pay | Admitting: Internal Medicine

## 2021-07-30 ENCOUNTER — Other Ambulatory Visit: Payer: Self-pay

## 2021-07-30 ENCOUNTER — Ambulatory Visit: Payer: BC Managed Care – PPO | Admitting: Internal Medicine

## 2021-07-30 VITALS — BP 130/86 | HR 70 | Temp 97.2°F | Ht 65.0 in | Wt 270.2 lb

## 2021-07-30 DIAGNOSIS — M25561 Pain in right knee: Secondary | ICD-10-CM

## 2021-07-30 DIAGNOSIS — I10 Essential (primary) hypertension: Secondary | ICD-10-CM

## 2021-07-30 DIAGNOSIS — K219 Gastro-esophageal reflux disease without esophagitis: Secondary | ICD-10-CM

## 2021-07-30 DIAGNOSIS — Z1329 Encounter for screening for other suspected endocrine disorder: Secondary | ICD-10-CM | POA: Diagnosis not present

## 2021-07-30 DIAGNOSIS — R739 Hyperglycemia, unspecified: Secondary | ICD-10-CM | POA: Diagnosis not present

## 2021-07-30 DIAGNOSIS — J309 Allergic rhinitis, unspecified: Secondary | ICD-10-CM | POA: Diagnosis not present

## 2021-07-30 DIAGNOSIS — M170 Bilateral primary osteoarthritis of knee: Secondary | ICD-10-CM

## 2021-07-30 DIAGNOSIS — E785 Hyperlipidemia, unspecified: Secondary | ICD-10-CM

## 2021-07-30 DIAGNOSIS — Z1389 Encounter for screening for other disorder: Secondary | ICD-10-CM

## 2021-07-30 DIAGNOSIS — Z8261 Family history of arthritis: Secondary | ICD-10-CM | POA: Diagnosis not present

## 2021-07-30 DIAGNOSIS — Z1231 Encounter for screening mammogram for malignant neoplasm of breast: Secondary | ICD-10-CM

## 2021-07-30 DIAGNOSIS — R7303 Prediabetes: Secondary | ICD-10-CM | POA: Insufficient documentation

## 2021-07-30 LAB — COMPREHENSIVE METABOLIC PANEL
ALT: 11 U/L (ref 0–35)
AST: 10 U/L (ref 0–37)
Albumin: 4 g/dL (ref 3.5–5.2)
Alkaline Phosphatase: 78 U/L (ref 39–117)
BUN: 15 mg/dL (ref 6–23)
CO2: 32 mEq/L (ref 19–32)
Calcium: 9.2 mg/dL (ref 8.4–10.5)
Chloride: 97 mEq/L (ref 96–112)
Creatinine, Ser: 0.96 mg/dL (ref 0.40–1.20)
GFR: 68.64 mL/min (ref >60.00)
Glucose, Bld: 99 mg/dL (ref 70–99)
Potassium: 3.7 mEq/L (ref 3.5–5.1)
Sodium: 138 mEq/L (ref 135–145)
Total Bilirubin: 0.5 mg/dL (ref 0.2–1.2)
Total Protein: 6.9 g/dL (ref 6.0–8.3)

## 2021-07-30 LAB — CBC WITH DIFFERENTIAL/PLATELET
Basophils Absolute: 0.1 10*3/uL (ref 0.0–0.1)
Basophils Relative: 1.1 % (ref 0.0–3.0)
Eosinophils Absolute: 0.2 10*3/uL (ref 0.0–0.7)
Eosinophils Relative: 2.2 % (ref 0.0–5.0)
HCT: 42.6 % (ref 36.0–46.0)
Hemoglobin: 13.8 g/dL (ref 12.0–15.0)
Lymphocytes Relative: 27.5 % (ref 12.0–46.0)
Lymphs Abs: 2.1 10*3/uL (ref 0.7–4.0)
MCHC: 32.4 g/dL (ref 30.0–36.0)
MCV: 82.7 fl (ref 78.0–100.0)
Monocytes Absolute: 0.7 10*3/uL (ref 0.1–1.0)
Monocytes Relative: 9.6 % (ref 3.0–12.0)
Neutro Abs: 4.5 10*3/uL (ref 1.4–7.7)
Neutrophils Relative %: 59.6 % (ref 43.0–77.0)
Platelets: 314 10*3/uL (ref 150.0–400.0)
RBC: 5.15 Mil/uL — ABNORMAL HIGH (ref 3.87–5.11)
RDW: 17.6 % — ABNORMAL HIGH (ref 11.5–15.5)
WBC: 7.6 10*3/uL (ref 4.0–10.5)

## 2021-07-30 LAB — LIPID PANEL
Cholesterol: 193 mg/dL (ref 0–200)
HDL: 38.2 mg/dL — ABNORMAL LOW (ref >39.00)
LDL Cholesterol: 125 mg/dL — ABNORMAL HIGH (ref 0–99)
NonHDL: 155.1
Total CHOL/HDL Ratio: 5
Triglycerides: 150 mg/dL — ABNORMAL HIGH (ref 0.0–149.0)
VLDL: 30 mg/dL (ref 0.0–40.0)

## 2021-07-30 LAB — TSH: TSH: 1.32 u[IU]/mL (ref 0.35–5.50)

## 2021-07-30 LAB — HEMOGLOBIN A1C: Hgb A1c MFr Bld: 5.9 % (ref 4.6–6.5)

## 2021-07-30 MED ORDER — LANSOPRAZOLE 30 MG PO CPDR: 30.0000 mg | DELAYED_RELEASE_CAPSULE | Freq: Every day | ORAL | 3 refills | Status: DC

## 2021-07-30 MED ORDER — MONTELUKAST SODIUM 10 MG PO TABS: ORAL_TABLET | ORAL | 3 refills | Status: DC

## 2021-07-30 MED ORDER — LISINOPRIL-HYDROCHLOROTHIAZIDE 20-12.5 MG PO TABS: 2.0000 | ORAL_TABLET | Freq: Every day | ORAL | 3 refills | Status: DC

## 2021-07-30 MED ORDER — CETIRIZINE HCL 10 MG PO TABS: 10.0000 mg | ORAL_TABLET | Freq: Every day | ORAL | 3 refills | Status: DC

## 2021-07-30 MED ORDER — ATORVASTATIN CALCIUM 10 MG PO TABS: 10.0000 mg | ORAL_TABLET | Freq: Every day | ORAL | 3 refills | Status: DC

## 2021-07-30 NOTE — Patient Instructions (Addendum)
Call back when ready for colonoscopy  ? ?Dr. Glennon Mac is at [facility] clinic call to make appt pap due 08/22/2021  ? ? Department Care Team Description  ? Office Visit [facility]   ?[address]   ?[place], [place] [address]   ?[phone]    ?[place]   ?[place]   ?[place], [place] [address]   ?[phone]   ?[phone] (Fax)    ? ?

## 2021-07-30 NOTE — Progress Notes (Signed)
Chief Complaint  ?Patient presents with  ? Follow-up  ? Knee Pain  ?  Knee pain and swelling in the right knee for 1-2 weeks. Rated 10/10 pain reducing mobility, Patient's ability to bear weight on the right leg, and ability to go up steps.   ? ?F/u  ?1. Righ tknee pain and swelling 10/10 tried knee brace worse x 1 week otc ibuprofen works at [employer] and worse walking 13-18K steps per day 8 hours days h/o left knee arthritis wants referral ortho  ?2. Htn controlled on lis hctz 10-12.5 and norvasc 5  ? ? ?Review of Systems  ?Constitutional:  Negative for weight loss.  ?HENT:  Negative for hearing loss.   ?Eyes:  Negative for blurred vision.  ?Respiratory:  Negative for shortness of breath.   ?Cardiovascular:  Negative for chest pain.  ?Gastrointestinal:  Negative for abdominal pain and blood in stool.  ?Genitourinary:  Negative for dysuria.  ?Musculoskeletal:  Positive for joint pain. Negative for falls.  ?Skin:  Negative for rash.  ?Neurological:  Negative for headaches.  ?Psychiatric/Behavioral:  Negative for depression.   ?Past Medical History:  ?Diagnosis Date  ? Allergy   ? Hypertension   ? ?Past Surgical History:  ?Procedure Laterality Date  ? NO PAST SURGERIES    ? ?Family History  ?Problem Relation Age of Onset  ? Hypertension Mother   ? Hypertension Brother   ? Hypertension Maternal Grandmother   ? Hypertension Maternal Grandfather   ? ?Social History  ? ?Socioeconomic History  ? Marital status: Significant Other  ?  Spouse name: Not on file  ? Number of children: Not on file  ? Years of education: Not on file  ? Highest education level: Not on file  ?Occupational History  ? Not on file  ?Tobacco Use  ? Smoking status: Every Day  ? Smokeless tobacco: Never  ?Vaping Use  ? Vaping Use: Never used  ?Substance and Sexual Activity  ? Alcohol use: Not Currently  ? Drug use: Not Currently  ? Sexual activity: Yes  ?  Birth control/protection: Surgical, I.U.D.  ?  Comment: tubaligation  ?Other Topics Concern  ? Not  on file  ?Social History Narrative  ? Divorced   ? Prefers women  ? Smoker   ? Owns guns   ? Wears seat belt   ? Safe in relationship female  ? 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 ?Social Determinants of Health  ? ?Financial Resource Strain: Not on file  ?Food Insecurity: Not on file  ?Transportation Needs: Not on file  ?Physical Activity: Not on file  ?Stress: Not on file  ?Social Connections: Not on file  ?Intimate Partner Violence: Not on file  ? ?Current Meds  ?Medication Sig  ? amLODipine (NORVASC) 5 MG tablet TAKE 1 TABLET BY MOUTH EVERY DAY  ? Cholecalciferol (DIALYVITE VITAMIN D 5000 PO) Take 5,000 Units by mouth daily.  ? OMEGA-3 FATTY ACIDS PO Take by mouth.  ? [DISCONTINUED] cetirizine (ZYRTEC) 10 MG tablet Take 1 tablet (10 mg total) by mouth daily.  ? [DISCONTINUED] lansoprazole (PREVACID) 30 MG capsule TAKE 1 CAPSULE (30 MG TOTAL) BY MOUTH DAILY. [place]  ? [DISCONTINUED] lisinopril-hydrochlorothiazide (ZESTORETIC) 20-12.5 MG tablet TAKE 2 TABLETS BY MOUTH EVERY DAY  ? [DISCONTINUED] montelukast (SINGULAIR) 10 MG tablet TAKE 1 TABLET BY MOUTH EVERYDAY AT BEDTIME  ? ?No Known Allergies ?No results found for this or any previous visit (from the past 2160 hour(s)). ?Objective  ?  Body mass index is 44.96 kg/m?. ?Wt Readings from Last 3 Encounters:  ?07/30/21 270 lb 3.2 oz (122.6 kg)  ?09/07/20 276 lb 12.8 oz (125.6 kg)  ?07/20/20 274 lb (124.3 kg)  ? ?Temp Readings from Last 3 Encounters:  ?07/30/21 (!) 97.2 ?F (36.2 ?C) (Oral)  ?09/07/20 97.8 ?F (36.6 ?C) (Oral)  ?03/29/20 97.7 ?F (36.5 ?C) (Oral)  ? ?BP Readings from Last 3 Encounters:  ?07/30/21 130/86  ?09/07/20 118/74  ?07/20/20 134/86  ? ?Pulse Readings from Last 3 Encounters:  ?07/30/21 70  ?09/07/20 88  ?03/29/20 85  ? ? ?Physical Exam ?Vitals and nursing note reviewed.  ?Constitutional:   ?   Appearance: Normal appearance. She is well-developed and well-groomed.  ?HENT:  ?   Head: Normocephalic and atraumatic.  ?Eyes:  ?    Conjunctiva/sclera: Conjunctivae normal.  ?   Pupils: Pupils are equal, round, and reactive to light.  ?Cardiovascular:  ?   Rate and Rhythm: Normal rate and regular rhythm.  ?   Heart sounds: Normal heart sounds. No murmur heard. ?Pulmonary:  ?   Effort: Pulmonary effort is normal.  ?   Breath sounds: Normal breath sounds.  ?Abdominal:  ?   General: Abdomen is flat. Bowel sounds are normal.  ?   Tenderness: There is no abdominal tenderness.  ?Musculoskeletal:     ?   General: No tenderness.  ?Skin: ?   General: Skin is warm and dry.  ?Neurological:  ?   General: No focal deficit present.  ?   Mental Status: She is alert and oriented to person, place, and time. Mental status is at baseline.  ?   Cranial Nerves: Cranial nerves 2-12 are intact.  ?   Sensory: Sensation is intact.  ?   Motor: Motor function is intact.  ?   Coordination: Coordination is intact.  ?   Gait: Gait is intact.  ?Psychiatric:     ?   Attention and Perception: Attention and perception normal.     ?   Mood and Affect: Mood and affect normal.     ?   Speech: Speech normal.     ?   Behavior: Behavior normal. Behavior is cooperative.     ?   Thought Content: Thought content normal.     ?   Cognition and Memory: Cognition and memory normal.     ?   Judgment: Judgment normal.  ? ? ?Assessment  ?Plan  ?Acute pain of right knee - Plan: Ambulatory referral to Orthopedic Surgery,  ?H/o left knee arthritis  ?FH: arthritis ? ?Allergic rhinitis, unspecified seasonality, unspecified trigger - Plan: montelukast (SINGULAIR) 10 MG tablet, cetirizine (ZYRTEC) 10 MG tablet ? ?Essential hypertension sl elevated will monitor - Plan: lisinopril-hydrochlorothiazide (ZESTORETIC) 20-12.5 MG tablet, norvasc 5 mg qd  ?Comprehensive metabolic panel, Lipid panel, CBC with Differential/Platelet ? ?Gastroesophageal reflux disease without esophagitis - Plan: lansoprazole (PREVACID) 30 MG capsule ? ?Hyperlipidemia, unspecified hyperlipidemia type - Plan: atorvastatin (LIPITOR)  10 MG tablet ? ?Screening for blood or protein in urine - Plan: Urinalysis, Routine w reflex microscopic [facility] ? ? ? ?HM ?Labs from 02/2020 pt needs to get at work asap fasting add on tsh and UA  ?Declines , shingrix vaccine and flu shot  ?covid 2/2 consider 3rd dose in 5-6 months ?Tdap had 06/08/14  ?Immune MMR, hep B qual + consider quant in future  ?[facility] labs done 06/30/19 ordered 02/2020 not done ?  ?Declines STD check  ?Neg mammo due 12/08/18, 12/24/2018 [facility]  ?  Left breast lesion resolved not need dx mammo as of 09/07/19 ?mammo 12/07/19 negative [facility] at [facility] ?Ordered [facility] ?  ?Pap 08/23/2018 [facility] neg pap neg HPV due in 3 years but addendum stated + endometrial cells will refer back to ob/gyn just finished cycle at this time ob/gyn rec f/u in 1 year  ?-referred back due 08/23/19 [facility] appt sch 08/24/19 Dr. Leonides Schanz  ?-seeing Dr. Georgianne Fick [facility] +IUD as of 07/2020 switch back to ob/gyn Dr. Glennon Mac ?  ?Colonoscopy will refer to [facility] when new insurance 10/2021  ?  ?Smoking cut back rec stop pt has reduced  ?  ?rec mvt with iron  ?Provider: Dr. Olivia Mackie McLean-Scocuzza-Internal Medicine  ?

## 2021-07-31 DIAGNOSIS — M170 Bilateral primary osteoarthritis of knee: Secondary | ICD-10-CM | POA: Diagnosis not present

## 2021-07-31 LAB — URINALYSIS, ROUTINE W REFLEX MICROSCOPIC
Bilirubin, UA: NEGATIVE
Glucose, UA: NEGATIVE
Ketones, UA: NEGATIVE
Leukocytes,UA: NEGATIVE
Nitrite, UA: NEGATIVE
Protein,UA: NEGATIVE
RBC, UA: NEGATIVE
Specific Gravity, UA: 1.01 (ref 1.005–1.030)
Urobilinogen, Ur: 0.2 mg/dL (ref 0.2–1.0)
pH, UA: 6.5 (ref 5.0–7.5)

## 2021-08-31 ENCOUNTER — Other Ambulatory Visit: Payer: Self-pay | Admitting: Internal Medicine

## 2021-08-31 DIAGNOSIS — I10 Essential (primary) hypertension: Secondary | ICD-10-CM

## 2021-12-20 ENCOUNTER — Other Ambulatory Visit: Payer: Self-pay

## 2021-12-20 ENCOUNTER — Telehealth: Payer: Self-pay

## 2021-12-20 DIAGNOSIS — I10 Essential (primary) hypertension: Secondary | ICD-10-CM

## 2021-12-20 MED ORDER — AMLODIPINE BESYLATE 5 MG PO TABS: 5.0000 mg | ORAL_TABLET | Freq: Every day | ORAL | 3 refills | Status: DC

## 2021-12-20 NOTE — Telephone Encounter (Signed)
Patient states she needs a refill for her amLODipine (NORVASC) 5 MG tablet.  Patient states she does not have refills left for this medication.  Patient does have an appointment scheduled for 01/17/2022, to meet with Dr. Olivia Mackie McLean-Scocuzza.    **Patient states she has changed her pharmacy to [facility] [address] in [place].

## 2022-01-17 ENCOUNTER — Encounter: Payer: Self-pay | Admitting: Internal Medicine

## 2022-01-17 ENCOUNTER — Ambulatory Visit (INDEPENDENT_AMBULATORY_CARE_PROVIDER_SITE_OTHER): Payer: BC Managed Care – PPO | Admitting: Internal Medicine

## 2022-01-17 VITALS — BP 138/88 | HR 67 | Temp 98.6°F | Ht 65.0 in | Wt 277.6 lb

## 2022-01-17 DIAGNOSIS — K219 Gastro-esophageal reflux disease without esophagitis: Secondary | ICD-10-CM

## 2022-01-17 DIAGNOSIS — J309 Allergic rhinitis, unspecified: Secondary | ICD-10-CM

## 2022-01-17 DIAGNOSIS — E785 Hyperlipidemia, unspecified: Secondary | ICD-10-CM

## 2022-01-17 DIAGNOSIS — R7303 Prediabetes: Secondary | ICD-10-CM

## 2022-01-17 DIAGNOSIS — Z1211 Encounter for screening for malignant neoplasm of colon: Secondary | ICD-10-CM | POA: Diagnosis not present

## 2022-01-17 DIAGNOSIS — Z1231 Encounter for screening mammogram for malignant neoplasm of breast: Secondary | ICD-10-CM | POA: Diagnosis not present

## 2022-01-17 DIAGNOSIS — I10 Essential (primary) hypertension: Secondary | ICD-10-CM

## 2022-01-17 DIAGNOSIS — Z124 Encounter for screening for malignant neoplasm of cervix: Secondary | ICD-10-CM

## 2022-01-17 DIAGNOSIS — Z0000 Encounter for general adult medical examination without abnormal findings: Secondary | ICD-10-CM | POA: Diagnosis not present

## 2022-01-17 MED ORDER — MONTELUKAST SODIUM 10 MG PO TABS: ORAL_TABLET | ORAL | 3 refills | Status: DC

## 2022-01-17 MED ORDER — ATORVASTATIN CALCIUM 10 MG PO TABS: 10.0000 mg | ORAL_TABLET | Freq: Every day | ORAL | 3 refills | Status: DC

## 2022-01-17 MED ORDER — LISINOPRIL-HYDROCHLOROTHIAZIDE 20-12.5 MG PO TABS: 2.0000 | ORAL_TABLET | Freq: Every day | ORAL | 3 refills | Status: DC

## 2022-01-17 MED ORDER — CETIRIZINE HCL 10 MG PO TABS: 10.0000 mg | ORAL_TABLET | Freq: Every day | ORAL | 3 refills | Status: DC

## 2022-01-17 MED ORDER — PANTOPRAZOLE SODIUM 40 MG PO TBEC: 40.0000 mg | DELAYED_RELEASE_TABLET | Freq: Every day | ORAL | 3 refills | Status: DC

## 2022-01-17 MED ORDER — AMLODIPINE BESYLATE 5 MG PO TABS: 5.0000 mg | ORAL_TABLET | Freq: Every day | ORAL | 3 refills | Status: DC

## 2022-01-17 NOTE — Progress Notes (Signed)
Chief Complaint  Patient presents with   Follow-up    6 month f/u   Annual Exam   F/u/annual  1. Htn norvasc 5 mg qd and lis hctz 20-12.5 2 pills hld on lipitor 10 mg qd  Sl elevated BP today will repeat     Review of Systems  Constitutional:  Negative for weight loss.  HENT:  Negative for hearing loss.   Eyes:  Negative for blurred vision.  Respiratory:  Negative for shortness of breath.   Cardiovascular:  Negative for chest pain.  Gastrointestinal:  Negative for abdominal pain and blood in stool.  Genitourinary:  Negative for dysuria.  Musculoskeletal:  Positive for joint pain. Negative for falls.  Skin:  Negative for rash.  Neurological:  Negative for headaches.  Psychiatric/Behavioral:  Negative for depression.    Past Medical History:  Diagnosis Date   Allergy    Hypertension    Past Surgical History:  Procedure Laterality Date   NO PAST SURGERIES     Family History  Problem Relation Age of Onset   Hypertension Mother    Hypertension Brother    Hypertension Maternal Grandmother    Hypertension Maternal Grandfather    Social History   Socioeconomic History   Marital status: Significant Other    Spouse name: Not on file   Number of children: Not on file   Years of education: Not on file   Highest education level: Not on file  Occupational History   Not on file  Tobacco Use   Smoking status: Every Day   Smokeless tobacco: Never  Vaping Use   Vaping Use: Never used  Substance and Sexual Activity   Alcohol use: Not Currently   Drug use: Not Currently   Sexual activity: Yes    Birth control/protection: Surgical, I.U.D.    Comment: tubaligation  Other Topics Concern   Not on file  Social History Narrative   Divorced    Prefers women   Smoker    Owns guns    Wears seat belt    Safe in relationship female   Some college    Estate manager/land agent    Social Determinants of Health   Financial Resource Strain: Not on file  Food Insecurity: Not on file   Transportation Needs: Not on file  Physical Activity: Not on file  Stress: Not on file  Social Connections: Not on file  Intimate Partner Violence: Not on file   Current Meds  Medication Sig   Cholecalciferol (DIALYVITE VITAMIN D 5000 PO) Take 5,000 Units by mouth daily.   OMEGA-3 FATTY ACIDS PO Take by mouth.   [DISCONTINUED] atorvastatin (LIPITOR) 10 MG tablet Take 1 tablet (10 mg total) by mouth daily at 6 PM. At night   [DISCONTINUED] cetirizine (ZYRTEC) 10 MG tablet Take 1 tablet (10 mg total) by mouth daily.   [DISCONTINUED] lisinopril-hydrochlorothiazide (ZESTORETIC) 20-12.5 MG tablet Take 2 tablets by mouth daily. In am   [DISCONTINUED] montelukast (SINGULAIR) 10 MG tablet TAKE 1 TABLET BY MOUTH EVERYDAY AT BEDTIME   [DISCONTINUED] pantoprazole (PROTONIX) 40 MG tablet Take 1 tablet (40 mg total) by mouth daily.   No Known Allergies No results found for this or any previous visit (from the past 2160 hour(s)). Objective  Body mass index is 46.2 kg/m. Wt Readings from Last 3 Encounters:  01/17/22 277 lb 9.6 oz (125.9 kg)  07/30/21 270 lb 3.2 oz (122.6 kg)  09/07/20 276 lb 12.8 oz (125.6 kg)   Temp Readings from Last 3 Encounters:  01/17/22 98.6 F (37 C) (Oral)  07/30/21 (!) 97.2 F (36.2 C) (Oral)  09/07/20 97.8 F (36.6 C) (Oral)   BP Readings from Last 3 Encounters:  01/17/22 138/88  07/30/21 130/86  09/07/20 118/74   Pulse Readings from Last 3 Encounters:  01/17/22 67  07/30/21 70  09/07/20 88    Physical Exam Vitals and nursing note reviewed.  Constitutional:      Appearance: Normal appearance. She is well-developed and well-groomed.  HENT:     Head: Normocephalic and atraumatic.  Eyes:     Conjunctiva/sclera: Conjunctivae normal.     Pupils: Pupils are equal, round, and reactive to light.  Cardiovascular:     Rate and Rhythm: Normal rate and regular rhythm.     Heart sounds: Normal heart sounds. No murmur heard. Pulmonary:     Effort: Pulmonary  effort is normal.     Breath sounds: Normal breath sounds.  Abdominal:     General: Abdomen is flat. Bowel sounds are normal.     Tenderness: There is no abdominal tenderness.  Musculoskeletal:        General: No tenderness.  Skin:    General: Skin is warm and dry.  Neurological:     General: No focal deficit present.     Mental Status: She is alert and oriented to person, place, and time. Mental status is at baseline.     Cranial Nerves: Cranial nerves 2-12 are intact.     Motor: Motor function is intact.     Coordination: Coordination is intact.     Gait: Gait is intact.  Psychiatric:        Attention and Perception: Attention and perception normal.        Mood and Affect: Mood and affect normal.        Speech: Speech normal.        Behavior: Behavior normal. Behavior is cooperative.        Thought Content: Thought content normal.        Cognition and Memory: Cognition and memory normal.        Judgment: Judgment normal.     Assessment  Plan  Annual physical exam See below  Essential hypertension - Plan: Comprehensive metabolic panel, Lipid panel, Hemoglobin A1c, CBC with Differential/Platelet, amLODipine (NORVASC) 5 MG tablet, lisinopril-hydrochlorothiazide (ZESTORETIC) 20-12.5 MG tablet Sl elevated monitor   Hyperlipidemia, unspecified hyperlipidemia type - Plan: Lipid panel, atorvastatin (LIPITOR) 10 MG tablet  Prediabetes Lifestyle changes  Gastroesophageal reflux disease without esophagitis - Plan: pantoprazole (PROTONIX) 40 MG tablet  Allergic rhinitis, unspecified seasonality, unspecified trigger - Plan: cetirizine (ZYRTEC) 10 MG tablet, montelukast (SINGULAIR) 10 MG tablet   HM Declines , shingrix, pna vaccine and flu shot  covid 2/2 consider 3rd dose in 5-6 months Tdap had 06/08/14  Immune MMR, hep B qual + consider quant in future  [facility] labs done 06/30/19 ordered 02/2020 not done   Declines STD check  Neg mammo due 12/08/18, 12/24/2018 [facility]  Left  breast lesion resolved not need dx mammo as of 09/07/19 mammo 12/07/19 negative [facility] at work Ordered [facility]   Pap 08/23/2018 [facility] neg pap neg HPV due in 3 years but addendum stated + endometrial cells will refer back to ob/gyn just finished cycle at this time ob/gyn rec f/u in 1 year  -referred back due 08/23/19 [facility] appt sch 08/24/19 Dr. Leonides Schanz  -seeing Dr. Georgianne Fick [facility] +IUD as of 07/2020 switch back to ob/gyn Dr. Glennon Mac   Colonoscopy will refer to  [facility]    Smoking cut back rec stop pt has reduced    rec mvt with iron   Provider: Dr. Olivia Mackie McLean-Scocuzza-Internal Medicine

## 2022-01-17 NOTE — Patient Instructions (Addendum)
[facility]   Dr. Glennon Mac   Phone Fax E-mail Address  [phone] Not available [place]   [place] [address]     Specialties     Obstetrics and Gynecology      Dr. Tiana Loft  Call for colonoscopy  Title Provider type  MD Physician   Primary Contact Information  Phone Fax E-mail Address  [phone] Not available [place]   [place] [address]     Specialties     Gastroenterology

## 2022-01-30 ENCOUNTER — Ambulatory Visit: Payer: BC Managed Care – PPO | Admitting: Internal Medicine

## 2022-02-20 DIAGNOSIS — H524 Presbyopia: Secondary | ICD-10-CM | POA: Diagnosis not present

## 2022-02-28 ENCOUNTER — Other Ambulatory Visit: Payer: Self-pay | Admitting: Gastroenterology

## 2022-02-28 ENCOUNTER — Ambulatory Visit (AMBULATORY_SURGERY_CENTER): Payer: Self-pay | Admitting: *Deleted

## 2022-02-28 VITALS — Ht 65.0 in | Wt 272.0 lb

## 2022-02-28 DIAGNOSIS — Z1211 Encounter for screening for malignant neoplasm of colon: Secondary | ICD-10-CM

## 2022-02-28 MED ORDER — NA SULFATE-K SULFATE-MG SULF 17.5-3.13-1.6 GM/177ML PO SOLN: 1.0000 | Freq: Once | ORAL | 0 refills | Status: DC

## 2022-02-28 NOTE — Progress Notes (Signed)
Phone pre visit No egg or soy allergy known to patient  No issues known to pt with past sedation with any surgeries or procedures Patient denies ever being told they had issues or difficulty with intubation  No FH of Malignant Hyperthermia Pt is not on diet pills Pt is not on  home 02  Pt is not on blood thinners  Pt denies issues with constipation  No A fib or A flutter Have any cardiac testing pending--no Pt instructed to use [employer] or GoodRx for a price reduction on prep

## 2022-03-14 ENCOUNTER — Encounter: Payer: BC Managed Care – PPO | Admitting: Gastroenterology

## 2022-03-17 ENCOUNTER — Encounter: Payer: Self-pay | Admitting: Gastroenterology

## 2022-03-22 ENCOUNTER — Encounter: Payer: Self-pay | Admitting: Certified Registered Nurse Anesthetist

## 2022-03-24 ENCOUNTER — Ambulatory Visit (AMBULATORY_SURGERY_CENTER): Payer: BC Managed Care – PPO | Admitting: Gastroenterology

## 2022-03-24 ENCOUNTER — Encounter: Payer: Self-pay | Admitting: Gastroenterology

## 2022-03-24 VITALS — BP 103/65 | HR 67 | Temp 97.8°F | Resp 12 | Ht 65.0 in | Wt 272.0 lb

## 2022-03-24 DIAGNOSIS — K635 Polyp of colon: Secondary | ICD-10-CM

## 2022-03-24 DIAGNOSIS — D124 Benign neoplasm of descending colon: Secondary | ICD-10-CM

## 2022-03-24 DIAGNOSIS — Z1211 Encounter for screening for malignant neoplasm of colon: Secondary | ICD-10-CM

## 2022-03-24 DIAGNOSIS — D123 Benign neoplasm of transverse colon: Secondary | ICD-10-CM

## 2022-03-24 DIAGNOSIS — D125 Benign neoplasm of sigmoid colon: Secondary | ICD-10-CM

## 2022-03-24 MED ORDER — SODIUM CHLORIDE 0.9 % IV SOLN: 500.0000 mL | INTRAVENOUS | Status: DC

## 2022-03-24 NOTE — Progress Notes (Signed)
Called to room to assist during endoscopic procedure.  Patient ID and intended procedure confirmed with present staff. Received instructions for my participation in the procedure from the performing physician.  

## 2022-03-24 NOTE — Progress Notes (Signed)
1330 Spoke with patient about what she ate previous day and pt stated she ate toast, 2 boiled eggs around 1030 to 1100.  Spoke with Dr Havery Moros and was told to give patient option to do case or reschedule.  Pt stated she would prefer to reschedule if we thought prep was not adequate, Dr Havery Moros spoke with patient, case will be done today.

## 2022-03-24 NOTE — Patient Instructions (Signed)
Resume previous diet and medications. Awaiting pathology results. Repeat Colonoscopy date to be determined based on pathology results.  YOU HAD AN ENDOSCOPIC PROCEDURE TODAY AT THE [facility]:   Refer to the procedure report that was given to you for any specific questions about what was found during the examination.  If the procedure report does not answer your questions, please call your gastroenterologist to clarify.  If you requested that your care partner not be given the details of your procedure findings, then the procedure report has been included in a sealed envelope for you to review at your convenience later.  YOU SHOULD EXPECT: Some feelings of bloating in the abdomen. Passage of more gas than usual.  Walking can help get rid of the air that was put into your GI tract during the procedure and reduce the bloating. If you had a lower endoscopy (such as a colonoscopy or flexible sigmoidoscopy) you may notice spotting of blood in your stool or on the toilet paper. If you underwent a bowel prep for your procedure, you may not have a normal bowel movement for a few days.  Please Note:  You might notice some irritation and congestion in your nose or some drainage.  This is from the oxygen used during your procedure.  There is no need for concern and it should clear up in a day or so.  SYMPTOMS TO REPORT IMMEDIATELY:  Following lower endoscopy (colonoscopy or flexible sigmoidoscopy):  Excessive amounts of blood in the stool  Significant tenderness or worsening of abdominal pains  Swelling of the abdomen that is new, acute  Fever of 100F or higher   For urgent or emergent issues, a gastroenterologist can be reached at any hour by calling [phone]. Do not use MyChart messaging for urgent concerns.    DIET:  We do recommend a small meal at first, but then you may proceed to your regular diet.  Drink plenty of fluids but you should avoid alcoholic beverages for 24  hours.  ACTIVITY:  You should plan to take it easy for the rest of today and you should NOT DRIVE or use heavy machinery until tomorrow (because of the sedation medicines used during the test).    FOLLOW UP: Our staff will call the number listed on your records the next business day following your procedure.  We will call around 7:15- 8:00 am to check on you and address any questions or concerns that you may have regarding the information given to you following your procedure. If we do not reach you, we will leave a message.     If any biopsies were taken you will be contacted by phone or by letter within the next 1-3 weeks.  Please call us at [phone] if you have not heard about the biopsies in 3 weeks.    SIGNATURES/CONFIDENTIALITY: You and/or your care partner have signed paperwork which will be entered into your electronic medical record.  These signatures attest to the fact that that the information above on your After Visit Summary has been reviewed and is understood.  Full responsibility of the confidentiality of this discharge information lies with you and/or your care-partner. 

## 2022-03-24 NOTE — Progress Notes (Signed)
[facility] History and Physical   Primary Care Physician:  McLean-Scocuzza, Nino Glow, MD   Reason for Procedure:   Colon cancer screening  Plan:    colonoscopy     HPI: Sally Harmon is a 51 y.o. female  here for colonoscopy screening - first time exam  . Patient denies any bowel symptoms at this time. No family history of colon cancer known. Otherwise feels well without any cardiopulmonary symptoms.   She did eat 2 eggs and toast yesterday AM for breakfast. States otherwise clear diet the rest of the day, prep went well and she is passing clear fluid per rectum, no solid stools. Discussed options, her prep sounds like it is adequate today to proceed. She understands if visualization is poor however then she may need to be rescheduled at another time for another exam. She wished to proceed after discussion of this issue and risks / benefits  I have discussed risks / benefits of anesthesia and endoscopic procedure with Sally Harmon and they wish to proceed with the exams as outlined today.    Past Medical History:  Diagnosis Date   Allergy    Arthritis    Hyperlipidemia    Hypertension     Past Surgical History:  Procedure Laterality Date   CERVIX SURGERY     ESOPHAGUS SURGERY     NO PAST SURGERIES      Prior to Admission medications   Medication Sig Start Date End Date Taking? Authorizing Provider  amLODipine (NORVASC) 5 MG tablet Take 1 tablet (5 mg total) by mouth daily. 01/17/22 01/12/23 Yes McLean-Scocuzza, Nino Glow, MD  atorvastatin (LIPITOR) 10 MG tablet Take 1 tablet (10 mg total) by mouth daily at 6 PM. At night 01/17/22  Yes McLean-Scocuzza, Nino Glow, MD  Cholecalciferol (DIALYVITE VITAMIN D 5000 PO) Take 5,000 Units by mouth daily.   Yes [provider]  lisinopril-hydrochlorothiazide (ZESTORETIC) 20-12.5 MG tablet Take 2 tablets by mouth daily. In am 01/17/22  Yes McLean-Scocuzza, Nino Glow, MD  montelukast (SINGULAIR) 10 MG tablet TAKE 1 TABLET BY  MOUTH EVERYDAY AT BEDTIME 01/17/22  Yes McLean-Scocuzza, Nino Glow, MD  cetirizine (ZYRTEC) 10 MG tablet Take 1 tablet (10 mg total) by mouth daily. Patient not taking: Reported on 02/28/2022 01/17/22   McLean-Scocuzza, Nino Glow, MD  Fluocinolone Acetonide Scalp 0.01 % OIL APPLY 1 OUNCE 1 TO 2 TIMES PER WEEK AS NEEDED OR LEAVE ON SCALP OVERNIGHT MORE THAN 4 HOURS BEFORE WASHING OUT Patient not taking: Reported on 07/30/2021 09/07/20   McLean-Scocuzza, Nino Glow, MD  meloxicam (MOBIC) 15 MG tablet Take 15 mg by mouth 3 (three) times daily. 01/11/22   [provider]  OMEGA-3 FATTY ACIDS PO Take by mouth. Patient not taking: Reported on 03/24/2022    [provider]  pantoprazole (PROTONIX) 40 MG tablet Take 1 tablet (40 mg total) by mouth daily. Patient not taking: Reported on 02/28/2022 01/17/22   McLean-Scocuzza, Nino Glow, MD    Current Outpatient Medications  Medication Sig Dispense Refill   amLODipine (NORVASC) 5 MG tablet Take 1 tablet (5 mg total) by mouth daily. 90 tablet 3   atorvastatin (LIPITOR) 10 MG tablet Take 1 tablet (10 mg total) by mouth daily at 6 PM. At night 90 tablet 3   Cholecalciferol (DIALYVITE VITAMIN D 5000 PO) Take 5,000 Units by mouth daily.     lisinopril-hydrochlorothiazide (ZESTORETIC) 20-12.5 MG tablet Take 2 tablets by mouth daily. In am 180 tablet 3   montelukast (SINGULAIR)  10 MG tablet TAKE 1 TABLET BY MOUTH EVERYDAY AT BEDTIME 90 tablet 3   cetirizine (ZYRTEC) 10 MG tablet Take 1 tablet (10 mg total) by mouth daily. (Patient not taking: Reported on 02/28/2022) 90 tablet 3   Fluocinolone Acetonide Scalp 0.01 % OIL APPLY 1 OUNCE 1 TO 2 TIMES PER WEEK AS NEEDED OR LEAVE ON SCALP OVERNIGHT MORE THAN 4 HOURS BEFORE WASHING OUT (Patient not taking: Reported on 07/30/2021) 120 mL 11   meloxicam (MOBIC) 15 MG tablet Take 15 mg by mouth 3 (three) times daily.     OMEGA-3 FATTY ACIDS PO Take by mouth. (Patient not taking: Reported on 03/24/2022)     pantoprazole  (PROTONIX) 40 MG tablet Take 1 tablet (40 mg total) by mouth daily. (Patient not taking: Reported on 02/28/2022) 90 tablet 3   Current Facility-Administered Medications  Medication Dose Route Frequency Provider Last Rate Last Admin   0.9 %  sodium chloride infusion  500 mL Intravenous Continuous Aria Pickrell, Carlota Raspberry, MD        Allergies as of 03/24/2022   (No Known Allergies)    Family History  Problem Relation Age of Onset   Hypertension Mother    Hypertension Brother    Hypertension Maternal Grandmother    Hypertension Maternal Grandfather    Colon cancer Neg Hx    Colon polyps Neg Hx    Esophageal cancer Neg Hx    Rectal cancer Neg Hx    Stomach cancer Neg Hx     Social History   Socioeconomic History   Marital status: Significant Other    Spouse name: Not on file   Number of children: Not on file   Years of education: Not on file   Highest education level: Not on file  Occupational History   Not on file  Tobacco Use   Smoking status: Every Day   Smokeless tobacco: Never  Vaping Use   Vaping Use: Never used  Substance and Sexual Activity   Alcohol use: Not Currently   Drug use: Not Currently   Sexual activity: Yes    Birth control/protection: Surgical, I.U.D.    Comment: tubaligation  Other Topics Concern   Not on file  Social History Narrative   Divorced    Prefers women   Smoker    Owns guns    Wears seat belt    Safe in relationship female   Some college    Estate manager/land agent    Social Determinants of Health   Financial Resource Strain: Not on file  Food Insecurity: Not on file  Transportation Needs: Not on file  Physical Activity: Not on file  Stress: Not on file  Social Connections: Not on file  Intimate Partner Violence: Not on file    Review of Systems: All other review of systems negative except as mentioned in the HPI.  Physical Exam: Vital signs BP (!) 143/66   Pulse 81   Temp 97.8 F (36.6 C)   Ht '5\' 5"'$  (1.651 m)   Wt 272 lb  (123.4 kg)   SpO2 97%   BMI 45.26 kg/m   General:   Alert,  Well-developed, pleasant and cooperative in NAD Lungs:  Clear throughout to auscultation.   Heart:  Regular rate and rhythm Abdomen:  Soft, nontender and nondistended.   Neuro/Psych:  Alert and cooperative. Normal mood and affect. A and O x 3  Jolly Mango, MD [facility]

## 2022-03-24 NOTE — Op Note (Signed)
[facility] Patient Name: Sally Harmon Procedure Date: 03/24/2022 2:01 PM MRN: [id number] Endoscopist: Remo Lipps P. Havery Moros , MD, [id number] Age: 51 Referring MD:  Date of Birth: [birth year] Gender: Female Account #: [id number] Procedure:                Colonoscopy Indications:              Screening for colorectal malignant neoplasm, This                            is the patient's first colonoscopy Medicines:                Monitored Anesthesia Care Procedure:                Pre-Anesthesia Assessment:                           - Prior to the procedure, a History and Physical                            was performed, and patient medications and                            allergies were reviewed. The patient's tolerance of                            previous anesthesia was also reviewed. The risks                            and benefits of the procedure and the sedation                            options and risks were discussed with the patient.                            All questions were answered, and informed consent                            was obtained. Prior Anticoagulants: The patient has                            taken no anticoagulant or antiplatelet agents. ASA                            Grade Assessment: III - A patient with severe                            systemic disease. After reviewing the risks and                            benefits, the patient was deemed in satisfactory                            condition to undergo the procedure.  After obtaining informed consent, the colonoscope                            was passed under direct vision. Throughout the                            procedure, the patient's blood pressure, pulse, and                            oxygen saturations were monitored continuously. The                            CF [id number] #[id number] was introduced through the anus                            and advanced  to the the cecum, identified by                            appendiceal orifice and ileocecal valve. The                            colonoscopy was performed without difficulty. The                            patient tolerated the procedure well. The quality                            of the bowel preparation was good. The ileocecal                            valve, appendiceal orifice, and rectum were                            photographed. Scope In: 2:16:25 PM Scope Out: 2:36:24 PM Scope Withdrawal Time: 0 hours 18 minutes 4 seconds  Total Procedure Duration: 0 hours 19 minutes 59 seconds  Findings:                 The perianal and digital rectal examinations were                            normal.                           A 3 mm polyp was found in the hepatic flexure. The                            polyp was sessile. The polyp was removed with a                            cold snare. Resection and retrieval were complete.                           A 3 mm polyp was found in the descending colon. The  polyp was sessile. The polyp was removed with a                            cold snare. Resection and retrieval were complete.                           A 3 mm polyp was found in the sigmoid colon. The                            polyp was sessile. The polyp was removed with a                            cold snare. Resection and retrieval were complete.                           Multiple small-mouthed diverticula were found in                            the left colon and right colon.                           Internal hemorrhoids were found during retroflexion.                           The exam was otherwise without abnormality. Complications:            No immediate complications. Estimated blood loss:                            Minimal. Estimated Blood Loss:     Estimated blood loss was minimal. Impression:               - One 3 mm polyp at the hepatic flexure,  removed                            with a cold snare. Resected and retrieved.                           - One 3 mm polyp in the descending colon, removed                            with a cold snare. Resected and retrieved.                           - One 3 mm polyp in the sigmoid colon, removed with                            a cold snare. Resected and retrieved.                           - Diverticulosis in the left colon and in the right  colon.                           - Internal hemorrhoids.                           - The examination was otherwise normal. Recommendation:           - Patient has a contact number available for                            emergencies. The signs and symptoms of potential                            delayed complications were discussed with the                            patient. Return to normal activities tomorrow.                            Written discharge instructions were provided to the                            patient.                           - Resume previous diet.                           - Continue present medications.                           - Await pathology results. Remo Lipps P. Salena Ortlieb, MD 03/24/2022 2:43:02 PM This report has been signed electronically.

## 2022-03-24 NOTE — Progress Notes (Signed)
Report given to PACU, vss 

## 2022-03-25 ENCOUNTER — Telehealth: Payer: Self-pay | Admitting: *Deleted

## 2022-03-25 NOTE — Telephone Encounter (Signed)
  Follow up Call-     03/24/2022    1:12 PM  Call back number  Post procedure Call Back phone  # [phone]  Permission to leave phone message Yes     Patient questions:  Do you have a fever, pain , or abdominal swelling? No. Pain Score  0 *  Have you tolerated food without any problems? Yes.    Have you been able to return to your normal activities? Yes.    Do you have any questions about your discharge instructions: Diet   No. Medications  No. Follow up visit  No.  Do you have questions or concerns about your Care? No.  Actions: * If pain score is 4 or above: No action needed, pain <4.

## 2022-05-19 DIAGNOSIS — R7309 Other abnormal glucose: Secondary | ICD-10-CM | POA: Diagnosis not present

## 2022-06-19 DIAGNOSIS — R7309 Other abnormal glucose: Secondary | ICD-10-CM | POA: Diagnosis not present

## 2022-06-23 DIAGNOSIS — Z20822 Contact with and (suspected) exposure to covid-19: Secondary | ICD-10-CM | POA: Diagnosis not present

## 2022-06-23 DIAGNOSIS — U071 COVID-19: Secondary | ICD-10-CM | POA: Diagnosis not present

## 2022-06-23 DIAGNOSIS — Z6841 Body Mass Index (BMI) 40.0 and over, adult: Secondary | ICD-10-CM | POA: Diagnosis not present

## 2022-06-26 ENCOUNTER — Other Ambulatory Visit (HOSPITAL_BASED_OUTPATIENT_CLINIC_OR_DEPARTMENT_OTHER): Payer: Self-pay

## 2022-07-18 DIAGNOSIS — R7309 Other abnormal glucose: Secondary | ICD-10-CM | POA: Diagnosis not present

## 2022-07-24 ENCOUNTER — Encounter: Payer: BC Managed Care – PPO | Admitting: Family Medicine

## 2022-08-18 DIAGNOSIS — R7309 Other abnormal glucose: Secondary | ICD-10-CM | POA: Diagnosis not present

## 2022-09-17 DIAGNOSIS — R7309 Other abnormal glucose: Secondary | ICD-10-CM | POA: Diagnosis not present

## 2022-09-24 ENCOUNTER — Encounter: Payer: Self-pay | Admitting: Family Medicine

## 2022-09-24 ENCOUNTER — Ambulatory Visit (INDEPENDENT_AMBULATORY_CARE_PROVIDER_SITE_OTHER): Payer: BC Managed Care – PPO | Admitting: Family Medicine

## 2022-09-24 VITALS — BP 128/82 | HR 73 | Ht 65.0 in | Wt 289.0 lb

## 2022-09-24 DIAGNOSIS — E785 Hyperlipidemia, unspecified: Secondary | ICD-10-CM

## 2022-09-24 DIAGNOSIS — R7303 Prediabetes: Secondary | ICD-10-CM

## 2022-09-24 DIAGNOSIS — E611 Iron deficiency: Secondary | ICD-10-CM

## 2022-09-24 DIAGNOSIS — G8929 Other chronic pain: Secondary | ICD-10-CM

## 2022-09-24 DIAGNOSIS — Z6841 Body Mass Index (BMI) 40.0 and over, adult: Secondary | ICD-10-CM

## 2022-09-24 DIAGNOSIS — Z1159 Encounter for screening for other viral diseases: Secondary | ICD-10-CM

## 2022-09-24 DIAGNOSIS — Z114 Encounter for screening for human immunodeficiency virus [HIV]: Secondary | ICD-10-CM | POA: Diagnosis not present

## 2022-09-24 DIAGNOSIS — I10 Essential (primary) hypertension: Secondary | ICD-10-CM | POA: Diagnosis not present

## 2022-09-24 DIAGNOSIS — E559 Vitamin D deficiency, unspecified: Secondary | ICD-10-CM

## 2022-09-24 DIAGNOSIS — K219 Gastro-esophageal reflux disease without esophagitis: Secondary | ICD-10-CM

## 2022-09-24 DIAGNOSIS — M25562 Pain in left knee: Secondary | ICD-10-CM

## 2022-09-24 DIAGNOSIS — M25561 Pain in right knee: Secondary | ICD-10-CM

## 2022-09-24 DIAGNOSIS — D649 Anemia, unspecified: Secondary | ICD-10-CM

## 2022-09-24 DIAGNOSIS — Z1231 Encounter for screening mammogram for malignant neoplasm of breast: Secondary | ICD-10-CM

## 2022-09-24 LAB — CBC WITH DIFFERENTIAL/PLATELET
Basophils Absolute: 0.1 10*3/uL (ref 0.0–0.1)
Basophils Relative: 0.9 % (ref 0.0–3.0)
Eosinophils Absolute: 0.1 10*3/uL (ref 0.0–0.7)
Eosinophils Relative: 2.1 % (ref 0.0–5.0)
HCT: 42.7 % (ref 36.0–46.0)
Hemoglobin: 14.2 g/dL (ref 12.0–15.0)
Lymphocytes Relative: 33.2 % (ref 12.0–46.0)
Lymphs Abs: 2.3 10*3/uL (ref 0.7–4.0)
MCHC: 33.2 g/dL (ref 30.0–36.0)
MCV: 85 fl (ref 78.0–100.0)
Monocytes Absolute: 0.7 10*3/uL (ref 0.1–1.0)
Monocytes Relative: 10.1 % (ref 3.0–12.0)
Neutro Abs: 3.8 10*3/uL (ref 1.4–7.7)
Neutrophils Relative %: 53.7 % (ref 43.0–77.0)
Platelets: 290 10*3/uL (ref 150.0–400.0)
RBC: 5.02 Mil/uL (ref 3.87–5.11)
RDW: 17 % — ABNORMAL HIGH (ref 11.5–15.5)
WBC: 7 10*3/uL (ref 4.0–10.5)

## 2022-09-24 LAB — COMPREHENSIVE METABOLIC PANEL
ALT: 17 U/L (ref 0–35)
AST: 15 U/L (ref 0–37)
Albumin: 3.7 g/dL (ref 3.5–5.2)
Alkaline Phosphatase: 76 U/L (ref 39–117)
BUN: 14 mg/dL (ref 6–23)
CO2: 31 mEq/L (ref 19–32)
Calcium: 8.9 mg/dL (ref 8.4–10.5)
Chloride: 98 mEq/L (ref 96–112)
Creatinine, Ser: 0.99 mg/dL (ref 0.40–1.20)
GFR: 65.61 mL/min (ref >60.00)
Glucose, Bld: 92 mg/dL (ref 70–99)
Potassium: 3.7 mEq/L (ref 3.5–5.1)
Sodium: 139 mEq/L (ref 135–145)
Total Bilirubin: 0.6 mg/dL (ref 0.2–1.2)
Total Protein: 6.7 g/dL (ref 6.0–8.3)

## 2022-09-24 LAB — TSH: TSH: 1.19 u[IU]/mL (ref 0.35–5.50)

## 2022-09-24 LAB — HEMOGLOBIN A1C: Hgb A1c MFr Bld: 6 % (ref 4.6–6.5)

## 2022-09-24 LAB — LIPID PANEL
Cholesterol: 209 mg/dL — ABNORMAL HIGH (ref 0–200)
HDL: 32 mg/dL — ABNORMAL LOW (ref >39.00)
LDL Cholesterol: 146 mg/dL — ABNORMAL HIGH (ref 0–99)
NonHDL: 176.99
Total CHOL/HDL Ratio: 7
Triglycerides: 153 mg/dL — ABNORMAL HIGH (ref 0.0–149.0)
VLDL: 30.6 mg/dL (ref 0.0–40.0)

## 2022-09-24 LAB — VITAMIN B12: Vitamin B-12: 244 pg/mL (ref 211–911)

## 2022-09-24 LAB — VITAMIN D 25 HYDROXY (VIT D DEFICIENCY, FRACTURES): VITD: 56.32 ng/mL (ref 30.00–100.00)

## 2022-09-24 NOTE — Progress Notes (Signed)
SUBJECTIVE:   Chief Complaint  Patient presents with   Establish Care   Hand Pain    Right, thinks from overuse/typing, numbness/tingling in thumb and index finger; does wear compression glove    Obesity    Has been going to gym but having difficulty losing weight; changed diet and going to gym   HPI Patient presents to clinic to transfer care  Hypertension Asymptomatic.  Does not check blood pressures at home.  Takes amlodipine 5 mg daily and Zestoretic 20-12.5 mg 2 tabs daily.  Tolerating medication well.  Denies any headaches, visual changes, chest pain, shortness of breath or lower extremity edema.  Hyperlipidemia Prescribed Lipitor 10 mg daily.  Tolerating medication well however has not taken for about 1 month as she ran out of refills.    More with obesity Has been working out at the gym 4-5 times weekly for the past couple of months and endorses unable to lose weight.  Reports changed diet and is more health-conscious.  Endorses being frustrated with lack of weight loss.  Right hand pain Chronic issue.  Has been ongoing for few years.  Endorses numbness/tingling of right hand.  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  Has increased workouts/weightlifting at gym recently.  Recently began using compression glove.  Reports taking Mobic 15 mg 3 times daily and requesting refills.  Was previously prescribed for knee pain.  GERD Previously prescribed Protonix but self discontinued.  Now takes Pepcid [facility] daily as needed.  Doing well with OTC medication.   PERTINENT PMH / PSH: Hypertension Hyperlipidemia CTS Morbid obesity Prediabetes IDA GERD Tobacco use, 10-year pack history, Black and milds.   OBJECTIVE:  BP 128/82   Pulse 73   Ht 5\' 5"  (1.651 m)   Wt 289 lb (131.1 kg)   SpO2 95%   BMI 48.09 kg/m    Physical Exam Vitals reviewed.  Constitutional:      General: She is not in acute distress.    Appearance: She is  obese. She is not ill-appearing.  HENT:     Head: Normocephalic.     Nose: Nose normal.  Eyes:     Conjunctiva/sclera: Conjunctivae normal.  Neck:     Thyroid: No thyromegaly or thyroid tenderness.  Cardiovascular:     Rate and Rhythm: Normal rate and regular rhythm.     Heart sounds: Normal heart sounds.  Pulmonary:     Effort: Pulmonary effort is normal.     Breath sounds: Normal breath sounds.  Abdominal:     General: Abdomen is flat. Bowel sounds are normal.     Palpations: Abdomen is soft.  Musculoskeletal:        General: Normal range of motion.     Cervical back: Normal range of motion.  Neurological:     Mental Status: She is alert and oriented to person, place, and time. Mental status is at baseline.  Psychiatric:        Mood and Affect: Mood normal.        Behavior: Behavior normal.        Thought Content: Thought content normal.        Judgment: Judgment normal.     ASSESSMENT/PLAN:  Essential hypertension Assessment & Plan: Chronic.  Well-controlled. Continue amlodipine 5 mg daily Continue Zestoretic 20-12.5 mg 2 tablets twice daily Check c-Met Monitor blood pressure at home.  Goal less than 130/80.   Orders: -     Comprehensive metabolic panel  Encounter for hepatitis C screening test for low risk patient -     Hepatitis C antibody  Encounter for screening for HIV -     HIV Antibody (routine testing w rflx)  Vitamin D deficiency Assessment & Plan: Reports history of vitamin D low.  Not currently on supplementation Check D level  Orders: -     VITAMIN D 25 Hydroxy (Vit-D Deficiency, Fractures)  Morbid obesity with BMI of 45.0-49.9, adult [facility]) Assessment & Plan: Chronic.  Continued elevation of BMI and weight increase despite increased activity for the past couple of months in efforts to portion control and make healthy choices. Offered dietitian consult.  Patient politely declined Offered healthy weight and wellness referral.  Patient  declined Would benefit from GLP-1 or bariatric surgery referral.  Plan to discuss at next visit. Check obesity labs today.  Orders: -     Hemoglobin A1c -     Comprehensive metabolic panel -     TSH -     Vitamin B12  Prediabetes Assessment & Plan: Chronic.  Asymptomatic.  BMI elevated Check A1c  Orders: -     Hemoglobin A1c  Hyperlipidemia, unspecified hyperlipidemia type Assessment & Plan: Chronic.  Has been off statin therapy for x 1 month. 10-year ASCVD intermediate risk mace 12.0% Recheck fasting lipids Likely will need to restart statin therapy. Recommend Crestor 20 mg daily, pending results.   Orders: -     Lipid panel -     Rosuvastatin Calcium; Take 1 tablet (20 mg total) by mouth daily.  Dispense: 90 tablet; Refill: 3 -     Aspirin; Take 1 tablet (81 mg total) by mouth daily. Swallow whole.  Breast cancer screening by mammogram -     3D Screening Mammogram, Left and Right; Future  Gastroesophageal reflux disease, unspecified whether esophagitis present Assessment & Plan: Chronic.  Well-controlled on current medication.  Continue Pepcid AC 1 tablet daily OTC as needed Recommend smoking cessation Continue weight loss and exercise Follow-up as needed   Iron deficiency Assessment & Plan: Chronic.  Asymptomatic.  Previous history of low ferritin, low MCV, low normal hemoglobin Check CBC   Orders: -     CBC with Differential/Platelet  Chronic pain of both knees Assessment & Plan: Chronic.  Reports taking meloxicam 15 mg 3 times daily as needed. Educated that maximum dose of meloxicam daily is 15 mg.  Can increase risk of kidney failure, increased hypertension and GI bleed. Discontinue meloxicam Follow-up   HCM Hepatitis C/HIV screening labs today Recommend tetanus vaccine.  Patient declined today Recommend shingles vaccine.  Declined Mammogram due.  Referral placed.  Patient to call to schedule appointment. Last Pap 08/2018.  Normal, HPV negative.   Previously followed by OB/GYN at [facility] clinic.  Recommend she follow up with scheduling appointment to update cervical cancer screening Colonoscopy up-to-date.  Removal of polyps.  Biopsy confirmed serrated polyp x 1.  Recommend follow-up in 7-year.  Due 03/2029 Educated regarding smoking cessation.  1 800 now quit provided  PDMP reviewed  Return if symptoms worsen or fail to improve, for PCP.  Dana Allan, MD

## 2022-09-24 NOTE — Patient Instructions (Addendum)
It was a pleasure meeting you today. Thank you for allowing me to take part in your health care.  Our goals for today as we discussed include:  We will get some labs today.  If they are abnormal or we need to do something about them, I will call you.  If they are normal, I will send you a message on MyChart (if it is active) or a letter in the mail.  If you don't hear from [place] in 2 weeks, please call the office at the number below.   Referral sent for Mammogram. Please call to schedule appointment. [facility] [address] [place], [place] [address] [phone]   Recommend Shingles vaccine.  This is a 2 dose series and can be given at your local pharmacy.  Please talk to your pharmacist about this.   Recommend Tetanus Vaccination.  This is given every 10 years.   Please schedule PAP smear at your earliest convenience  If you would like some assistance to help quit smoking please schedule an appointment with me to discuss the many ways that we can assist you if and when you decide you are ready to take the next step toward a smoke free lifestyle.  1-800-QUITNOW  Stop meloxicam 15 mg 3 times a day.  This is above the recommended daily dose.   If you have any questions or concerns, please do not hesitate to call the office at [phone].  I look forward to our next visit and until then take care and stay safe.  Regards,   Dana Allan, MD   [facility]

## 2022-09-25 LAB — HIV ANTIBODY (ROUTINE TESTING W REFLEX): HIV 1&2 Ab, 4th Generation: NONREACTIVE

## 2022-09-25 LAB — HEPATITIS C ANTIBODY: Hepatitis C Ab: NONREACTIVE

## 2022-10-04 ENCOUNTER — Encounter: Payer: Self-pay | Admitting: Family Medicine

## 2022-10-09 NOTE — Telephone Encounter (Signed)
Patient would like to know if it can be a virtual visit?

## 2022-10-10 ENCOUNTER — Encounter: Payer: Self-pay | Admitting: Family Medicine

## 2022-10-10 DIAGNOSIS — Z114 Encounter for screening for human immunodeficiency virus [HIV]: Secondary | ICD-10-CM | POA: Insufficient documentation

## 2022-10-10 DIAGNOSIS — Z1231 Encounter for screening mammogram for malignant neoplasm of breast: Secondary | ICD-10-CM | POA: Insufficient documentation

## 2022-10-10 DIAGNOSIS — Z1159 Encounter for screening for other viral diseases: Secondary | ICD-10-CM | POA: Insufficient documentation

## 2022-10-10 MED ORDER — ROSUVASTATIN CALCIUM 20 MG PO TABS
20.0000 mg | ORAL_TABLET | Freq: Every day | ORAL | 3 refills | Status: DC
Start: 2022-10-10 — End: 2023-10-23

## 2022-10-10 MED ORDER — ASPIRIN 81 MG PO TBEC
81.0000 mg | DELAYED_RELEASE_TABLET | Freq: Every day | ORAL | Status: AC
Start: 2022-10-10 — End: ?

## 2022-10-10 NOTE — Assessment & Plan Note (Signed)
Chronic.  Reports taking meloxicam 15 mg 3 times daily as needed. Educated that maximum dose of meloxicam daily is 15 mg.  Can increase risk of kidney failure, increased hypertension and GI bleed. Discontinue meloxicam Follow-up

## 2022-10-10 NOTE — Assessment & Plan Note (Signed)
Chronic.  Asymptomatic.  Previous history of low ferritin, low MCV, low normal hemoglobin Check CBC

## 2022-10-10 NOTE — Assessment & Plan Note (Signed)
Chronic.  Well-controlled on current medication.  Continue Pepcid AC 1 tablet daily OTC as needed Recommend smoking cessation Continue weight loss and exercise Follow-up as needed

## 2022-10-10 NOTE — Assessment & Plan Note (Signed)
Chronic.  Has been off statin therapy for x 1 month. 10-year ASCVD intermediate risk mace 12.0% Recheck fasting lipids Likely will need to restart statin therapy. Recommend Crestor 20 mg daily, pending results.

## 2022-10-10 NOTE — Assessment & Plan Note (Signed)
Chronic.  Well-controlled. Continue amlodipine 5 mg daily Continue Zestoretic 20-12.5 mg 2 tablets twice daily Check c-Met Monitor blood pressure at home.  Goal less than 130/80.

## 2022-10-10 NOTE — Assessment & Plan Note (Signed)
Reports history of vitamin D low.  Not currently on supplementation Check D level

## 2022-10-10 NOTE — Assessment & Plan Note (Signed)
Chronic.  Asymptomatic.  BMI elevated Check A1c

## 2022-10-10 NOTE — Assessment & Plan Note (Signed)
Chronic.  Continued elevation of BMI and weight increase despite increased activity for the past couple of months in efforts to portion control and make healthy choices. Offered dietitian consult.  Patient politely declined Offered healthy weight and wellness referral.  Patient declined Would benefit from GLP-1 or bariatric surgery referral.  Plan to discuss at next visit. Check obesity labs today.

## 2022-10-18 DIAGNOSIS — R7309 Other abnormal glucose: Secondary | ICD-10-CM | POA: Diagnosis not present

## 2022-10-20 ENCOUNTER — Other Ambulatory Visit (HOSPITAL_COMMUNITY)
Admission: RE | Admit: 2022-10-20 | Discharge: 2022-10-20 | Disposition: A | Discharge status: Home / Self Care | Payer: BC Managed Care – PPO | Source: Ambulatory Visit | Attending: Family Medicine | Admitting: Family Medicine

## 2022-10-20 ENCOUNTER — Ambulatory Visit (INDEPENDENT_AMBULATORY_CARE_PROVIDER_SITE_OTHER): Payer: BC Managed Care – PPO | Admitting: Family Medicine

## 2022-10-20 VITALS — BP 140/88 | HR 76 | Temp 98.3°F | Ht 65.0 in | Wt 289.8 lb

## 2022-10-20 DIAGNOSIS — Z124 Encounter for screening for malignant neoplasm of cervix: Secondary | ICD-10-CM | POA: Insufficient documentation

## 2022-10-20 DIAGNOSIS — G5601 Carpal tunnel syndrome, right upper limb: Secondary | ICD-10-CM | POA: Diagnosis not present

## 2022-10-20 DIAGNOSIS — I10 Essential (primary) hypertension: Secondary | ICD-10-CM

## 2022-10-20 DIAGNOSIS — E782 Mixed hyperlipidemia: Secondary | ICD-10-CM

## 2022-10-20 DIAGNOSIS — Z0000 Encounter for general adult medical examination without abnormal findings: Secondary | ICD-10-CM

## 2022-10-20 DIAGNOSIS — Z23 Encounter for immunization: Secondary | ICD-10-CM | POA: Diagnosis not present

## 2022-10-20 MED ORDER — GABAPENTIN (ONCE-DAILY) 600 MG PO TABS: 300.0000 mg | ORAL_TABLET | Freq: Every evening | ORAL | 1 refills | Status: DC

## 2022-10-20 NOTE — Patient Instructions (Addendum)
It was a pleasure meeting you today. Thank you for allowing me to take part in your health care.  Our goals for today as we discussed include:  Will MyChart you the results of PAP today.   Received tetanus Vaccination.  This is given every 10 years.   Recommend Shingles vaccine.  This is a 2 dose series and can be given at your local pharmacy.  Please talk to your pharmacist about this.   Start gabapentin 600 mg, half a tablet at night. Use thumb spica splint on right hand at night mainly and when using hand for repetitive motion If no improvement can refer to orthopedics   If you have any questions or concerns, please do not hesitate to call the office at [phone].  I look forward to our next visit and until then take care and stay safe.  Regards,   Dana Allan, MD   [facility]

## 2022-10-20 NOTE — Progress Notes (Signed)
SUBJECTIVE:   Chief Complaint  Patient presents with   Gynecologic Exam   HPI Patient presents to clinic for annual Pap  Denies any vaginal discharge, vaginal bleeding.  Has IUD in place.  Sexually active with same-sex partner  Hypertension Asymptomatic.  Rushing to get to clinic.  Did take blood pressure medications today. Blood pressure at home normally 120's-130's  Hyperlipidemia Recently started Crestor 20 mg daily.  Reports fatigue and intermittent headache and some days.  Headaches self resolved.  Denies any myalgias.    Right wrist pain Continues to use compression glove however has noticed increasing numbness in right hand.  Has had similar symptoms in the past but now working more with computers and increased typing.  Denies any neck pain, fevers, upper extremity weakness, injury.  Numbness and tingling localized to wrist and hand and has become more bothersome.   Review of Systems - Negative except for listed above in HPI    PERTINENT PMH / PSH: Morbid Obesity HTN Carpal Tunnel HLD Tobacco use  OBJECTIVE:  BP (!) 140/88   Pulse 76   Temp 98.3 F (36.8 C)   Ht 5\' 5"  (1.651 m)   Wt 289 lb 12.8 oz (131.5 kg)   SpO2 99%   BMI 48.23 kg/m    Physical Exam Exam conducted with a chaperone present.  Genitourinary:    General: Normal vulva.     Labia:        Right: No lesion.        Left: No lesion.      Vagina: Normal.     Cervix: Normal.     Uterus: Normal.      Adnexa: Right adnexa normal and left adnexa normal.  Musculoskeletal:     Right wrist: Normal. No swelling, bony tenderness, snuff box tenderness or crepitus. Normal range of motion.     Left wrist: Normal. No swelling, bony tenderness, snuff box tenderness or crepitus. Normal range of motion.     Right hand: No tenderness. Normal range of motion. Normal capillary refill. Normal pulse.     Left hand: No tenderness. Normal range of motion. Normal capillary refill. Normal pulse.     Comments: Right  hand Positive Tinel's Negative Phalen's         09/24/2022   10:33 AM 01/17/2022    1:41 PM 09/07/2020    2:47 PM 09/07/2019    3:48 PM 05/25/2019    3:31 PM  Depression screen PHQ 2/9  Decreased Interest 0 0 0 0 0  Down, Depressed, Hopeless 0 0 0 0 0  PHQ - 2 Score 0 0 0 0 0  Altered sleeping 0    0  Tired, decreased energy 0    0  Change in appetite 0    0  Feeling bad or failure about yourself  0    0  Trouble concentrating 0    0  Moving slowly or fidgety/restless 0    0  Suicidal thoughts 0    0  PHQ-9 Score 0    0  Difficult doing work/chores Not difficult at all    Not difficult at all       09/24/2022   10:33 AM 09/07/2019    3:48 PM 05/25/2019    3:31 PM  GAD 7 : Generalized Anxiety Score  Nervous, Anxious, on Edge 0 0 0  Control/stop worrying 0 0 0  Worry too much - different things 0 0 0  Trouble relaxing 0 0 0  Restless 0 0 0  Easily annoyed or irritable 0 0 0  Afraid - awful might happen 0 0 0  Total GAD 7 Score 0 0 0  Anxiety Difficulty Not difficult at all Not difficult at all Not difficult at all      ASSESSMENT/PLAN:  Annual physical exam Assessment & Plan: HCM Hepatitis C/HIV screening completed Tetanus vaccine.   Recommend shingles vaccine.   Mammogram due.  Referral placed.  Patient to call to schedule appointment. Pap completed today  Recommend self breast exams regularly. No indication for yearly clinical breast exam Colonoscopy up-to-date.  Removal of polyps.  Biopsy confirmed serrated polyp x 1.  Recommend follow-up in 7-year.  Due 03/2029 Educated regarding smoking cessation.  1 800 now quit provided.  10 yr pack history.   Depression/GAD completed   Cervical cancer screening Assessment & Plan: UPT not indicated IUD strings visible Pap smear: performed with HPV cotesting   Orders: -     Cytology - PAP  Encounter for immunization -     Tdap vaccine greater than or equal to 7yo IM  Carpal tunnel syndrome of right wrist Assessment &  Plan: Chronic Positive Tinel's sign right hand Recommend Thumb spica splint at night and with repetitive movement Start Gabapentin 300 mg at night Follow up in 4 weeks, if no improvement refer to Ortho  Orders: -     Gabapentin (Once-Daily); Take 0.5 tablets (300 mg total) by mouth at bedtime.  Dispense: 15 tablet; Refill: 1  Essential hypertension Assessment & Plan: Chronic.  Elevated today and again on repeat.  BP at home normal.  Likely secondary to rushing to get to clinic and PAP. Continue Amlodipine 5 mg daily Continue Zestoretic 20-12.5 mg 2 tablets twice daily Monitor blood pressure at home.  Goal less than 130/80. If elevated at next visit will increase Norvasc   Mixed hyperlipidemia Assessment & Plan: Chronic.  Previously on Pravachol and had self discontinued Current 10-year ASCVD intermediate risk mace 15.2% Continue Crestor 20 mg daily, if symptoms of headache and fatigue continue in next week or two plan to lower dose or restart Pravachol. Patient agreeable to plan.      PDMP reviewed  Return if symptoms worsen or fail to improve.  Dana Allan, MD

## 2022-10-24 LAB — CYTOLOGY - PAP
Comment: NEGATIVE
Diagnosis: NEGATIVE
High risk HPV: NEGATIVE

## 2022-10-25 ENCOUNTER — Encounter: Payer: Self-pay | Admitting: Family Medicine

## 2022-10-25 DIAGNOSIS — Z124 Encounter for screening for malignant neoplasm of cervix: Secondary | ICD-10-CM | POA: Insufficient documentation

## 2022-10-25 DIAGNOSIS — Z23 Encounter for immunization: Secondary | ICD-10-CM | POA: Insufficient documentation

## 2022-10-25 DIAGNOSIS — G5601 Carpal tunnel syndrome, right upper limb: Secondary | ICD-10-CM | POA: Insufficient documentation

## 2022-10-25 NOTE — Assessment & Plan Note (Signed)
Chronic Positive Tinel's sign right hand Recommend Thumb spica splint at night and with repetitive movement Start Gabapentin 300 mg at night Follow up in 4 weeks, if no improvement refer to Ortho

## 2022-10-25 NOTE — Assessment & Plan Note (Signed)
UPT not indicated IUD strings visible Pap smear: performed with HPV cotesting

## 2022-10-25 NOTE — Assessment & Plan Note (Addendum)
HCM Hepatitis C/HIV screening completed Tetanus vaccine.   Recommend shingles vaccine.   Mammogram due.  Referral placed.  Patient to call to schedule appointment. Pap completed today  Recommend self breast exams regularly. No indication for yearly clinical breast exam Colonoscopy up-to-date.  Removal of polyps.  Biopsy confirmed serrated polyp x 1.  Recommend follow-up in 7-year.  Due 03/2029 Educated regarding smoking cessation.  1 800 now quit provided.  10 yr pack history.   Depression/GAD completed

## 2022-10-25 NOTE — Assessment & Plan Note (Signed)
Chronic.  Elevated today and again on repeat.  BP at home normal.  Likely secondary to rushing to get to clinic and PAP. Continue Amlodipine 5 mg daily Continue Zestoretic 20-12.5 mg 2 tablets twice daily Monitor blood pressure at home.  Goal less than 130/80. If elevated at next visit will increase Norvasc

## 2022-10-25 NOTE — Assessment & Plan Note (Signed)
Chronic.  Previously on Pravachol and had self discontinued Current 10-year ASCVD intermediate risk mace 15.2% Continue Crestor 20 mg daily, if symptoms of headache and fatigue continue in next week or two plan to lower dose or restart Pravachol. Patient agreeable to plan.

## 2022-10-28 ENCOUNTER — Telehealth: Payer: Self-pay

## 2022-10-28 NOTE — Telephone Encounter (Signed)
*  Primary  PA request received via CMM for Gabapentin (Once-Daily) 600MG  tablets  PA submitted to [facility] and is pending additional questions/determination  Key: [id number]

## 2022-10-29 ENCOUNTER — Other Ambulatory Visit (HOSPITAL_COMMUNITY): Payer: Self-pay

## 2022-10-29 NOTE — Telephone Encounter (Signed)
Patient Advocate Encounter   Received notification that prior authorization for Gabapentin (Once-Daily) 600MG  tablets is required.   PA submitted on 10/29/22 Key  [id number]  Status is pending

## 2022-11-04 ENCOUNTER — Telehealth: Payer: Self-pay | Admitting: Family Medicine

## 2022-11-04 DIAGNOSIS — G5601 Carpal tunnel syndrome, right upper limb: Secondary | ICD-10-CM

## 2022-11-04 NOTE — Telephone Encounter (Signed)
Pt called in stating that as per last visit with provider, she would like to move forward with that referral for Ortho.

## 2022-11-10 DIAGNOSIS — G5601 Carpal tunnel syndrome, right upper limb: Secondary | ICD-10-CM | POA: Diagnosis not present

## 2022-11-11 ENCOUNTER — Other Ambulatory Visit: Payer: Self-pay

## 2022-11-11 ENCOUNTER — Other Ambulatory Visit (HOSPITAL_COMMUNITY): Payer: Self-pay

## 2022-11-11 NOTE — Telephone Encounter (Signed)
Per test claim 30 day supply is $4.25

## 2022-11-17 DIAGNOSIS — R7309 Other abnormal glucose: Secondary | ICD-10-CM | POA: Diagnosis not present

## 2022-12-18 DIAGNOSIS — R7309 Other abnormal glucose: Secondary | ICD-10-CM | POA: Diagnosis not present

## 2022-12-19 DIAGNOSIS — J069 Acute upper respiratory infection, unspecified: Secondary | ICD-10-CM | POA: Diagnosis not present

## 2022-12-19 DIAGNOSIS — R059 Cough, unspecified: Secondary | ICD-10-CM | POA: Diagnosis not present

## 2023-01-18 DIAGNOSIS — R7309 Other abnormal glucose: Secondary | ICD-10-CM | POA: Diagnosis not present

## 2023-02-17 DIAGNOSIS — R7309 Other abnormal glucose: Secondary | ICD-10-CM | POA: Diagnosis not present

## 2023-03-20 DIAGNOSIS — R7309 Other abnormal glucose: Secondary | ICD-10-CM | POA: Diagnosis not present

## 2023-04-19 DIAGNOSIS — R7309 Other abnormal glucose: Secondary | ICD-10-CM | POA: Diagnosis not present

## 2023-04-28 DIAGNOSIS — G5601 Carpal tunnel syndrome, right upper limb: Secondary | ICD-10-CM | POA: Diagnosis not present

## 2023-05-07 ENCOUNTER — Other Ambulatory Visit: Payer: Self-pay | Admitting: Family Medicine

## 2023-05-07 DIAGNOSIS — I10 Essential (primary) hypertension: Secondary | ICD-10-CM

## 2023-05-07 NOTE — Telephone Encounter (Signed)
Copied from CRM [id number]. Topic: Clinical - Medication Refill >> May 07, 2023  4:44 PM Drema Balzarine wrote: Most Recent Primary Care Visit:  Provider: Dana Allan  Department: [facility]  Visit Type: OFFICE VISIT  Date: 10/20/2022  Medication: Lisinopril-hydrochlorothiazide and AmLODipine   Has the patient contacted their pharmacy? Yes, they told her to contact the office  (Agent: If no, request that the patient contact the pharmacy for the refill. If patient does not wish to contact the pharmacy document the reason why and proceed with request.) (Agent: If yes, when and what did the pharmacy advise?)  Is this the correct pharmacy for this prescription? Yes If no, delete pharmacy and type the correct one.  This is the patient's preferred pharmacy:   [id number] - [place], [place] - [address] [place] [address] Phone: [phone] Fax: [phone]   Has the prescription been filled recently? Yes  Is the patient out of the medication? Yes, completely out   Has the patient been seen for an appointment in the last year OR does the patient have an upcoming appointment?   Can we respond through MyChart?   Agent: Please be advised that Rx refills may take up to 3 business days. We ask that you follow-up with your pharmacy.

## 2023-05-08 ENCOUNTER — Telehealth: Payer: Self-pay

## 2023-05-08 ENCOUNTER — Other Ambulatory Visit: Payer: Self-pay

## 2023-05-08 DIAGNOSIS — I10 Essential (primary) hypertension: Secondary | ICD-10-CM

## 2023-05-08 MED ORDER — LISINOPRIL-HYDROCHLOROTHIAZIDE 20-12.5 MG PO TABS: 2.0000 | ORAL_TABLET | Freq: Every day | ORAL | 3 refills | Status: DC

## 2023-05-08 MED ORDER — AMLODIPINE BESYLATE 5 MG PO TABS
5.0000 mg | ORAL_TABLET | Freq: Every day | ORAL | 3 refills | Status: DC
Start: 2023-05-08 — End: 2023-10-23

## 2023-05-08 NOTE — Telephone Encounter (Signed)
Copied from CRM [id number]. Topic: Clinical - Medication Refill >> May 07, 2023  4:44 PM Drema Balzarine wrote: Most Recent Primary Care Visit:  Provider: Dana Allan  Department: [facility]  Visit Type: OFFICE VISIT  Date: 10/20/2022  Medication: Lisinopril-hydrochlorothiazide and AmLODipine   Has the patient contacted their pharmacy? Yes, they told her to contact the office  (Agent: If no, request that the patient contact the pharmacy for the refill. If patient does not wish to contact the pharmacy document the reason why and proceed with request.) (Agent: If yes, when and what did the pharmacy advise?)  Is this the correct pharmacy for this prescription? Yes If no, delete pharmacy and type the correct one.  This is the patient's preferred pharmacy:   [id number] - [place], [place] - [address] [place] [address] Phone: [phone] Fax: [phone]   Has the prescription been filled recently? Yes  Is the patient out of the medication? Yes, completely out   Has the patient been seen for an appointment in the last year OR does the patient have an upcoming appointment? Yes  Can we respond through MyChart? No  Agent: Please be advised that Rx refills may take up to 3 business days. We ask that you follow-up with your pharmacy.

## 2023-05-08 NOTE — Telephone Encounter (Signed)
Sent rx request to provider

## 2023-05-08 NOTE — Telephone Encounter (Signed)
RX request sent to provider.

## 2023-05-20 DIAGNOSIS — R7309 Other abnormal glucose: Secondary | ICD-10-CM | POA: Diagnosis not present

## 2023-05-22 NOTE — Progress Notes (Deleted)
   SUBJECTIVE:  No chief complaint on file.  HPI ***  PERTINENT PMH / PSH: ***  OBJECTIVE:  There were no vitals taken for this visit.   Physical Exam     09/24/2022   10:33 AM 01/17/2022    1:41 PM 09/07/2020    2:47 PM 09/07/2019    3:48 PM 05/25/2019    3:31 PM  Depression screen PHQ 2/9  Decreased Interest 0 0 0 0 0  Down, Depressed, Hopeless 0 0 0 0 0  PHQ - 2 Score 0 0 0 0 0  Altered sleeping 0    0  Tired, decreased energy 0    0  Change in appetite 0    0  Feeling bad or failure about yourself  0    0  Trouble concentrating 0    0  Moving slowly or fidgety/restless 0    0  Suicidal thoughts 0    0  PHQ-9 Score 0    0  Difficult doing work/chores Not difficult at all    Not difficult at all      09/24/2022   10:33 AM 09/07/2019    3:48 PM 05/25/2019    3:31 PM  GAD 7 : Generalized Anxiety Score  Nervous, Anxious, on Edge 0 0 0  Control/stop worrying 0 0 0  Worry too much - different things 0 0 0  Trouble relaxing 0 0 0  Restless 0 0 0  Easily annoyed or irritable 0 0 0  Afraid - awful might happen 0 0 0  Total GAD 7 Score 0 0 0  Anxiety Difficulty Not difficult at all Not difficult at all Not difficult at all    ASSESSMENT/PLAN:  Essential hypertension   PDMP reviewed***  No follow-ups on file.  Glenys Ferrari, MD

## 2023-05-25 ENCOUNTER — Ambulatory Visit: Payer: BC Managed Care – PPO | Admitting: Family Medicine

## 2023-05-25 ENCOUNTER — Telehealth: Payer: Self-pay | Admitting: Family Medicine

## 2023-05-25 DIAGNOSIS — I10 Essential (primary) hypertension: Secondary | ICD-10-CM

## 2023-05-25 NOTE — Telephone Encounter (Signed)
 Left message to call the office to reschedule, provider out of the office.

## 2023-06-08 ENCOUNTER — Ambulatory Visit (INDEPENDENT_AMBULATORY_CARE_PROVIDER_SITE_OTHER): Payer: BC Managed Care – PPO | Admitting: Family Medicine

## 2023-06-08 ENCOUNTER — Encounter: Payer: Self-pay | Admitting: Family Medicine

## 2023-06-08 VITALS — BP 136/86 | HR 83 | Temp 98.4°F | Resp 18 | Ht 65.0 in | Wt 291.2 lb

## 2023-06-08 DIAGNOSIS — J309 Allergic rhinitis, unspecified: Secondary | ICD-10-CM

## 2023-06-08 DIAGNOSIS — E782 Mixed hyperlipidemia: Secondary | ICD-10-CM | POA: Diagnosis not present

## 2023-06-08 DIAGNOSIS — K219 Gastro-esophageal reflux disease without esophagitis: Secondary | ICD-10-CM

## 2023-06-08 DIAGNOSIS — I10 Essential (primary) hypertension: Secondary | ICD-10-CM

## 2023-06-08 DIAGNOSIS — E559 Vitamin D deficiency, unspecified: Secondary | ICD-10-CM

## 2023-06-08 DIAGNOSIS — E538 Deficiency of other specified B group vitamins: Secondary | ICD-10-CM

## 2023-06-08 DIAGNOSIS — R7309 Other abnormal glucose: Secondary | ICD-10-CM

## 2023-06-08 MED ORDER — AZELASTINE HCL 0.1 % NA SOLN
2.0000 | Freq: Two times a day (BID) | NASAL | 12 refills | Status: AC
Start: 2023-06-08 — End: ?

## 2023-06-08 MED ORDER — FLUTICASONE PROPIONATE 50 MCG/ACT NA SUSP
2.0000 | Freq: Two times a day (BID) | NASAL | 6 refills | Status: AC
Start: 2023-06-08 — End: ?

## 2023-06-08 NOTE — Patient Instructions (Addendum)
It was a pleasure meeting you today. Thank you for allowing me to take part in your health care.  Our goals for today as we discussed include:  Start Flonase 2 sprays two times a day If no improvement, start Astelin 2 sprays two times a day.   Do both Flonase and Astelin at the same time  Restart Singulair and Zyrtec daily  Last annual was in June.  Schedule appointment for around this time. Can get labs when you come to visit.  Fast for 10 hours.    This is a list of the screening recommended for you and due dates:  Health Maintenance  Topic Date Due   Pneumococcal Vaccination (1 of 2 - PCV) Never done   Zoster (Shingles) Vaccine (1 of 2) Never done   Mammogram  12/06/2020   Flu Shot  Never done   COVID-19 Vaccine (3 - 2024-25 season) 01/18/2023   Pap with HPV screening  10/20/2027   Colon Cancer Screening  03/24/2029   DTaP/Tdap/Td vaccine (2 - Td or Tdap) 10/19/2032   Hepatitis C Screening  Completed   HIV Screening  Completed   HPV Vaccine  Aged Out    If you have any questions or concerns, please do not hesitate to call the office at [phone].  I look forward to our next visit and until then take care and stay safe.  Regards,   Dana Allan, MD   [facility]

## 2023-06-08 NOTE — Progress Notes (Signed)
SUBJECTIVE:   Chief Complaint  Patient presents with   Heartburn   Snoring   HPI Presents to clinic for acute visit  Discussed the use of AI scribe software for clinical note transcription with the patient, who gave verbal consent to proceed.  History of Present Illness The patient, with a history of hypertension and hyperlipidemia, presents for a follow-up visit primarily concerning persistent sinus issues and medication refills. They report experiencing sinus pressure and a dry cough, particularly when exposed to cold weather or heat. The patient describes the sinus pressure as intense, to the point of immobilizing their head, and is accompanied by a postnasal drip. They deny any associated fever.  The patient has been managing these symptoms with over-the-counter Mucinex and honey, which they report has provided some relief. They also mention a history of snoring, but it is unclear if this is related to their current sinus issues.  Regarding their medications, the patient has been taking Singulair and Zyrtec as needed, primarily during the winter months. They recently resumed these medications due to the worsening of their symptoms. They also report taking Pepcid twice daily, but have switched to an off-brand version due to cost.  The patient has been without their amlodipine for about a month and has been taking only one of their Zestoretic tablets daily due to a refill issue with the pharmacy. Despite this, they report feeling well overall and have not noticed any significant changes in their health.  The patient's hypertension and hyperlipidemia are managed with amlodipine and Zestoretic, respectively. They report no adverse effects from these medications. They also mention participating in a 25-day fast as part of a church activity.  In summary, the patient presents with persistent sinus issues and a dry cough, which they have been managing with over-the-counter remedies and  as-needed Singulair and Zyrtec. They have also been experiencing issues with their medication refills, resulting in an interruption of their amlodipine and a reduction in their Zestoretic dosage. Despite these issues, the patient reports feeling well overall.      PERTINENT PMH / PSH: As above  OBJECTIVE:  BP 136/86   Pulse 83   Temp 98.4 F (36.9 C)   Resp 18   Ht 5\' 5"  (1.651 m)   Wt 291 lb 4 oz (132.1 kg)   SpO2 98%   BMI 48.47 kg/m    Physical Exam Vitals reviewed.  Constitutional:      General: She is not in acute distress.    Appearance: Normal appearance. She is obese. She is not ill-appearing, toxic-appearing or diaphoretic.  Eyes:     General:        Right eye: No discharge.        Left eye: No discharge.     Conjunctiva/sclera: Conjunctivae normal.  Cardiovascular:     Rate and Rhythm: Normal rate and regular rhythm.     Heart sounds: Normal heart sounds.  Pulmonary:     Effort: Pulmonary effort is normal.     Breath sounds: Normal breath sounds.  Musculoskeletal:        General: Normal range of motion.  Skin:    General: Skin is warm and dry.  Neurological:     General: No focal deficit present.     Mental Status: She is alert and oriented to person, place, and time. Mental status is at baseline.  Psychiatric:        Mood and Affect: Mood normal.        Behavior:  Behavior normal.        Thought Content: Thought content normal.        Judgment: Judgment normal.           06/08/2023    1:04 PM 09/24/2022   10:33 AM 01/17/2022    1:41 PM 09/07/2020    2:47 PM 09/07/2019    3:48 PM  Depression screen PHQ 2/9  Decreased Interest 0 0 0 0 0  Down, Depressed, Hopeless 0 0 0 0 0  PHQ - 2 Score 0 0 0 0 0  Altered sleeping 0 0     Tired, decreased energy 0 0     Change in appetite 0 0     Feeling bad or failure about yourself  0 0     Trouble concentrating 0 0     Moving slowly or fidgety/restless 0 0     Suicidal thoughts 0 0     PHQ-9 Score 0 0      Difficult doing work/chores Not difficult at all Not difficult at all         06/08/2023    1:05 PM 09/24/2022   10:33 AM 09/07/2019    3:48 PM 05/25/2019    3:31 PM  GAD 7 : Generalized Anxiety Score  Nervous, Anxious, on Edge 0 0 0 0  Control/stop worrying 0 0 0 0  Worry too much - different things 0 0 0 0  Trouble relaxing 0 0 0 0  Restless 0 0 0 0  Easily annoyed or irritable 0 0 0 0  Afraid - awful might happen 0 0 0 0  Total GAD 7 Score 0 0 0 0  Anxiety Difficulty Not difficult at all Not difficult at all Not difficult at all Not difficult at all    ASSESSMENT/PLAN:  Essential hypertension Assessment & Plan: Patient has been inconsistently taking antihypertensive medications (Zestoretic and Amlodipine) due to pharmacy issues. Blood pressure is currently controlled. -Advise patient to resume regular use of antihypertensive medications. -Check blood pressure at home regularly and report readings via MyChart.  Orders: -     Comprehensive metabolic panel; Future -     CBC with Differential/Platelet; Future  Allergic rhinitis, unspecified seasonality, unspecified trigger Assessment & Plan: Complaints of sinus pressure and dry cough, likely due to postnasal drip. Patient has been using Singulair and Zyrtec intermittently, and has been self-medicating with Mucinex. -Advise consistent use of Singulair and Zyrtec, with Zyrtec at night and Singulair in the morning as per patient's preference. -Add Flonase, two sprays in each nostril twice daily. -Consider ENT referral if symptoms do not improve.  Orders: -     Fluticasone Propionate; Place 2 sprays into both nostrils in the morning and at bedtime.  Dispense: 16 g; Refill: 6 -     Azelastine HCl; Place 2 sprays into both nostrils 2 (two) times daily. Use in each nostril as directed  Dispense: 30 mL; Refill: 12  Mixed hyperlipidemia Assessment & Plan: Patient has been taking Crestor for cholesterol management. -Plan to check lipid  panel at next physical in May 2025.  Orders: -     Lipid panel; Future  Gastroesophageal reflux disease, unspecified whether esophagitis present Assessment & Plan: Patient has been managing symptoms with over-the-counter Pepcid. -Continue Pepcid as needed.   Abnormal glucose -     Hemoglobin A1c; Future  Vitamin D deficiency -     VITAMIN D 25 Hydroxy (Vit-D Deficiency, Fractures); Future  Vitamin B 12 deficiency -  Vitamin B12; Future    General Health Maintenance / Followup Plans -Schedule physical exam in May 2025. -Complete labs on the day of the physical exam.   PDMP reviewed  Return if symptoms worsen or fail to improve, for PCP.  Dana Allan, MD

## 2023-06-09 ENCOUNTER — Telehealth: Payer: Self-pay

## 2023-06-09 NOTE — Telephone Encounter (Signed)
Called pt and scheduled an appointment on 06/11/2023 @ 2:40

## 2023-06-09 NOTE — Telephone Encounter (Signed)
Copied from CRM [id number]. Topic: Clinical - Medical Advice >> Jun 09, 2023  1:36 PM Theodis Sato wrote: Reason for CRM: Patient was seen by Dr. Clent Ridges yesterday 1/20 and was given sinus medication but patient states her fiance was just diagnosed with the flu. Patient is requesting to speak with Dr. Clent Ridges or her nurse regarding this matter as she is concerned she may have the flu or could be spreading it around .

## 2023-06-10 ENCOUNTER — Ambulatory Visit: Payer: Self-pay | Admitting: Family Medicine

## 2023-06-10 ENCOUNTER — Encounter: Payer: Self-pay | Admitting: Family

## 2023-06-10 ENCOUNTER — Telehealth: Payer: BC Managed Care – PPO | Admitting: Family

## 2023-06-10 VITALS — Ht 65.0 in | Wt 291.0 lb

## 2023-06-10 DIAGNOSIS — J101 Influenza due to other identified influenza virus with other respiratory manifestations: Secondary | ICD-10-CM | POA: Diagnosis not present

## 2023-06-10 MED ORDER — ALBUTEROL SULFATE HFA 108 (90 BASE) MCG/ACT IN AERS
2.0000 | INHALATION_SPRAY | Freq: Four times a day (QID) | RESPIRATORY_TRACT | 1 refills | Status: AC | PRN
Start: 2023-06-10 — End: ?

## 2023-06-10 NOTE — Assessment & Plan Note (Signed)
Afebrile.  No respiratory distress.  Discussed conservative management with mild symptoms that appear to be improving.  She request a refill of albuterol to have on hand although does not have wheezing or shortness of breath at this time.  Provided refill of albuterol.  Work note provided.  Encouraged hydration and continued use of Mucinex fast.  Advised not to use cough preparation with decongestant in the setting of HTN.

## 2023-06-10 NOTE — Progress Notes (Signed)
Virtual Visit via Video Note  I connected with Sally Harmon on 06/10/23 at  9:30 AM EST by a video enabled telemedicine application and verified that I am speaking with the correct person using two identifiers. Location patient: home Location provider: work  Persons participating in the virtual visit: patient, provider  I discussed the limitations of evaluation and management by telemedicine and the availability of in person appointments. The patient expressed understanding and agreed to proceed.  HPI: Complains of cough x 4 days, improving.   Endorses nasal congestion  Tested positive for influenza A   she is taking Mucinex FAST without decongestant with relief.   Denies fever, chills, fatigue, wheezing  history of hypertension, GERD, allergic rhinitis  She likes to keep albuterol on hand; hers is expired and she would like to refill.   No h/o ckd  Partner has the flu   Seen 06/08/23 for allergic rhinitis and started azelastine, flonase nasal spray   ROS: See pertinent positives and negatives per HPI.  EXAM:  VITALS per patient if applicable: Ht 5\' 5"  (1.651 m)   Wt 291 lb (132 kg)   BMI 48.42 kg/m  BP Readings from Last 3 Encounters:  06/08/23 136/86  10/20/22 (!) 140/88  09/24/22 128/82   Wt Readings from Last 3 Encounters:  06/10/23 291 lb (132 kg)  06/08/23 291 lb 4 oz (132.1 kg)  10/20/22 289 lb 12.8 oz (131.5 kg)    GENERAL: alert, oriented, appears well and in no acute distress  HEENT: atraumatic, conjunttiva clear, no obvious abnormalities on inspection of external nose and ears  NECK: normal movements of the head and neck  LUNGS: on inspection no signs of respiratory distress, breathing rate appears normal, no obvious gross SOB, gasping or wheezing  CV: no obvious cyanosis  MS: moves all visible extremities without noticeable abnormality  PSYCH/NEURO: pleasant and cooperative, no obvious depression or anxiety, speech and thought processing  grossly intact  ASSESSMENT AND PLAN: Influenza A Assessment & Plan: Afebrile.  No respiratory distress.  Discussed conservative management with mild symptoms that appear to be improving.  She request a refill of albuterol to have on hand although does not have wheezing or shortness of breath at this time.  Provided refill of albuterol.  Work note provided.  Encouraged hydration and continued use of Mucinex fast.  Advised not to use cough preparation with decongestant in the setting of HTN.   Orders: -     Albuterol Sulfate HFA; Inhale 2 puffs into the lungs every 6 (six) hours as needed for wheezing or shortness of breath.  Dispense: 8 g; Refill: 1     -we discussed possible serious and likely etiologies, options for evaluation and workup, limitations of telemedicine visit vs in person visit, treatment, treatment risks and precautions. Pt prefers to treat via telemedicine empirically rather then risking or undertaking an in person visit at this moment.    I discussed the assessment and treatment plan with the patient. The patient was provided an opportunity to ask questions and all were answered. The patient agreed with the plan and demonstrated an understanding of the instructions.   The patient was advised to call back or seek an in-person evaluation if the symptoms worsen or if the condition fails to improve as anticipated.  Advised if desired AVS can be mailed or viewed via MyChart if Mychart user.   Rennie Plowman, FNP

## 2023-06-10 NOTE — Telephone Encounter (Signed)
Copied from CRM [id number]. Topic: Clinical - Medication Question >> Jun 10, 2023  8:14 AM Donita Brooks wrote: Reason for CRM: pt had a appointment tomorrow with provider for flu like symptoms. Pt took a test yesterday and tested positive for the flu. Since pt is positive for the flu, pt would not be attending appointment tomorrow. Pt wants doctor to send her in something for her flu.   Chief Complaint:  Diagnosed with Influenza A - 06/09/2023- Albuterol expired and would like antiviral treatment-  Cough is continuously - Requesting something for the cough. Cough is constant Symptoms:  cough   Frequency:  Continuously  Pertinent Negatives: Patient denies  fever Disposition: [] ED /[] Urgent Care (no appt availability in office) / [x] Appointment(In office/virtual)/ []  [facility]/ [] Home Care/ [] Refused Recommended Disposition /[] [facility]/ []  Follow-up with PCP Additional Notes: Scheduled  for today  video visit.  Reason for Disposition . [1] Continuous (nonstop) coughing interferes with work or school AND [2] no improvement using cough treatment per Care Advice  Answer Assessment - Initial Assessment Questions 1. WORST SYMPTOM: "What is your worst symptom?" (e.g., cough, runny nose, muscle aches, headache, sore throat, fever)       Cough 2. ONSET: "When did your flu symptoms start?"       Last Thursday 3. COUGH: "How bad is the cough?"        Dry cough that felt like a tickle -  Now  cough  is more constant. 4. RESPIRATORY DISTRESS: "Describe your breathing."       No  concerns  just the cough 5. FEVER: "Do you have a fever?" If Yes, ask: "What is your temperature, how was it measured, and when did it start?"      Denies  6. EXPOSURE: "Were you exposed to someone with influenza?"       Yes Fiance is in the hospital 7. FLU VACCINE: "Did you get a flu shot this year?"      Denies 8. HIGH RISK DISEASE: "Do you have any chronic medical problems?" (e.g., heart or lung  disease, asthma, weak immune system, or other HIGH RISK conditions)       High Blood  Pressure-  normally have Sinus  infection and allergies 10. OTHER SYMPTOMS: "Do you have any other symptoms?"  (e.g., runny nose, muscle aches, headache, sore throat)  Protocols used: Influenza (Flu) - Seasonal-A-AH, Cough - Acute Productive-A-AH

## 2023-06-10 NOTE — Patient Instructions (Signed)
You may continue Mucinex fast.  Please ensure that you do not take any over-the-counter medicine that is a decongestant such as phenylephrine or Sudafed  Stay hydrated. Work note provided in your letters tab  Influenza, Adult Influenza is also called the flu. It's an infection that affects your respiratory tract. This includes your nose, throat, windpipe, and lungs. The flu is contagious. This means it spreads easily from person to person. It causes symptoms that are like a cold. It can also cause a high fever and body aches. What are the causes? The flu is caused by the influenza virus. You can get it by: Breathing in droplets that are in the air after an infected person coughs or sneezes. Touching something that has the virus on it and then touching your mouth, nose, or eyes. What increases the risk? You may be more likely to get the flu if: You don't wash your hands often. You're near a lot of people during cold and flu season. You touch your mouth, eyes, or nose without washing your hands first. You don't get a flu shot each year. You may also be more at risk for the flu and serious problems, such as a lung infection called pneumonia, if: You're older than 65. You're pregnant. Your immune system is weak. Your immune system is your body's defense system. You have a long-term, or chronic, condition, such as: Heart, kidney, or lung disease. Diabetes. A liver disorder. Asthma. You're very overweight. You have anemia. This is when you don't have enough red blood cells in your body. What are the signs or symptoms? Flu symptoms often start all of a sudden. They may last 4-14 days and include: Fever and chills. Headaches, body aches, or muscle aches. Sore throat. Cough. Runny or stuffy nose. Discomfort in your chest. Not wanting to eat as much as normal. Feeling weak or tired. Feeling dizzy. Nausea or vomiting. How is this diagnosed? The flu may be diagnosed based on your  symptoms and medical history. You may also have a physical exam. A swab may be taken from your nose or throat and tested for the virus. How is this treated? If the flu is found early, you can be treated with antiviral medicine. This may be given to you by mouth or through an IV. It can help you feel less sick and get better faster. Taking care of yourself at home can also help your symptoms get better. Your health care provider may tell you to: Take over-the-counter medicines. Drink lots of fluids. The flu often goes away on its own. If you have very bad symptoms or problems caused by the flu, you may need to be treated in a hospital. Follow these instructions at home: Activity Rest as needed. Get lots of sleep. Stay home from work or school as told by your provider. Leave home only to go see your provider. Do not leave home for other reasons until you don't have a fever for 24 hours without taking medicine. Eating and drinking Take an oral rehydration solution (ORS). This is a drink that is sold at pharmacies and stores. Drink enough fluid to keep your pee pale yellow. Try to drink small amounts of clear fluids. These include water, ice chips, fruit juice mixed with water, and low-calorie sports drinks. Try to eat bland foods that are easy to digest. These include bananas, applesauce, rice, lean meats, toast, and crackers. Avoid drinks that have a lot of sugar or caffeine in them. These include energy drinks,  regular sports drinks, and soda. Do not drink alcohol. Do not eat spicy or fatty foods. General instructions     Take your medicines only as told by your provider. Use a cool mist humidifier to add moisture to the air in your home. This can make it easier for you to breathe. You should also clean the humidifier every day. To do so: Empty the water. Pour clean water in. Cover your mouth and nose when you cough or sneeze. Wash your hands with soap and water often and for at least 20  seconds. It's extra important to do so after you cough or sneeze. If you can't use soap and water, use hand sanitizer. How is this prevented?  Get a flu shot every year. Ask your provider when you should get your flu shot. Stay away from people who are sick during fall and winter. Fall and winter are cold and flu season. Contact a health care provider if: You get new symptoms. You have chest pain. You have watery poop, also called diarrhea. You have a fever. Your cough gets worse. You start to have more mucus. You feel like you may vomit, or you vomit. Get help right away if: You become short of breath or have trouble breathing. Your skin or nails turn blue. You have very bad pain or stiffness in your neck. You get a sudden headache or pain in your face or ear. You vomit each time you eat or drink. These symptoms may be an emergency. Call 911 right away. Do not wait to see if the symptoms will go away. Do not drive yourself to the hospital. This information is not intended to replace advice given to you by your health care provider. Make sure you discuss any questions you have with your health care provider. Document Revised: 02/05/2023 Document Reviewed: 06/12/2022 Elsevier Patient Education  2024 [employer].

## 2023-06-11 ENCOUNTER — Ambulatory Visit: Payer: BC Managed Care – PPO | Admitting: Family Medicine

## 2023-06-19 ENCOUNTER — Encounter: Payer: Self-pay | Admitting: Family Medicine

## 2023-06-19 NOTE — Assessment & Plan Note (Signed)
Complaints of sinus pressure and dry cough, likely due to postnasal drip. Patient has been using Singulair and Zyrtec intermittently, and has been self-medicating with Mucinex. -Advise consistent use of Singulair and Zyrtec, with Zyrtec at night and Singulair in the morning as per patient's preference. -Add Flonase, two sprays in each nostril twice daily. -Consider ENT referral if symptoms do not improve.

## 2023-06-19 NOTE — Assessment & Plan Note (Signed)
Patient has been inconsistently taking antihypertensive medications (Zestoretic and Amlodipine) due to pharmacy issues. Blood pressure is currently controlled. -Advise patient to resume regular use of antihypertensive medications. -Check blood pressure at home regularly and report readings via MyChart.

## 2023-06-19 NOTE — Assessment & Plan Note (Signed)
Patient has been taking Crestor for cholesterol management. -Plan to check lipid panel at next physical in May 2025.

## 2023-06-19 NOTE — Assessment & Plan Note (Signed)
Patient has been managing symptoms with over-the-counter Pepcid. -Continue Pepcid as needed.

## 2023-06-20 DIAGNOSIS — R7309 Other abnormal glucose: Secondary | ICD-10-CM | POA: Diagnosis not present

## 2023-07-18 DIAGNOSIS — R7309 Other abnormal glucose: Secondary | ICD-10-CM | POA: Diagnosis not present

## 2023-09-10 DIAGNOSIS — G5601 Carpal tunnel syndrome, right upper limb: Secondary | ICD-10-CM | POA: Diagnosis not present

## 2023-09-22 DIAGNOSIS — G5601 Carpal tunnel syndrome, right upper limb: Secondary | ICD-10-CM | POA: Diagnosis not present

## 2023-10-23 ENCOUNTER — Ambulatory Visit (INDEPENDENT_AMBULATORY_CARE_PROVIDER_SITE_OTHER): Admitting: Family Medicine

## 2023-10-23 ENCOUNTER — Encounter: Payer: BC Managed Care – PPO | Admitting: Family Medicine

## 2023-10-23 ENCOUNTER — Encounter: Payer: Self-pay | Admitting: Family Medicine

## 2023-10-23 VITALS — BP 130/82 | HR 76 | Temp 98.0°F | Resp 20 | Ht 65.0 in | Wt 288.2 lb

## 2023-10-23 DIAGNOSIS — E785 Hyperlipidemia, unspecified: Secondary | ICD-10-CM

## 2023-10-23 DIAGNOSIS — I10 Essential (primary) hypertension: Secondary | ICD-10-CM

## 2023-10-23 DIAGNOSIS — E782 Mixed hyperlipidemia: Secondary | ICD-10-CM

## 2023-10-23 DIAGNOSIS — Z0000 Encounter for general adult medical examination without abnormal findings: Secondary | ICD-10-CM

## 2023-10-23 DIAGNOSIS — E559 Vitamin D deficiency, unspecified: Secondary | ICD-10-CM | POA: Diagnosis not present

## 2023-10-23 DIAGNOSIS — Z6841 Body Mass Index (BMI) 40.0 and over, adult: Secondary | ICD-10-CM

## 2023-10-23 DIAGNOSIS — M25561 Pain in right knee: Secondary | ICD-10-CM

## 2023-10-23 DIAGNOSIS — E538 Deficiency of other specified B group vitamins: Secondary | ICD-10-CM

## 2023-10-23 DIAGNOSIS — M25562 Pain in left knee: Secondary | ICD-10-CM

## 2023-10-23 DIAGNOSIS — J309 Allergic rhinitis, unspecified: Secondary | ICD-10-CM

## 2023-10-23 DIAGNOSIS — R7309 Other abnormal glucose: Secondary | ICD-10-CM

## 2023-10-23 DIAGNOSIS — Z1231 Encounter for screening mammogram for malignant neoplasm of breast: Secondary | ICD-10-CM

## 2023-10-23 DIAGNOSIS — G8929 Other chronic pain: Secondary | ICD-10-CM

## 2023-10-23 LAB — CBC WITH DIFFERENTIAL/PLATELET
Basophils Absolute: 0.1 10*3/uL (ref 0.0–0.1)
Basophils Relative: 0.9 % (ref 0.0–3.0)
Eosinophils Absolute: 0.1 10*3/uL (ref 0.0–0.7)
Eosinophils Relative: 1.8 % (ref 0.0–5.0)
HCT: 41.9 % (ref 36.0–46.0)
Hemoglobin: 13.7 g/dL (ref 12.0–15.0)
Lymphocytes Relative: 23.8 % (ref 12.0–46.0)
Lymphs Abs: 2 10*3/uL (ref 0.7–4.0)
MCHC: 32.7 g/dL (ref 30.0–36.0)
MCV: 84 fl (ref 78.0–100.0)
Monocytes Absolute: 0.7 10*3/uL (ref 0.1–1.0)
Monocytes Relative: 8.2 % (ref 3.0–12.0)
Neutro Abs: 5.4 10*3/uL (ref 1.4–7.7)
Neutrophils Relative %: 65.3 % (ref 43.0–77.0)
Platelets: 342 10*3/uL (ref 150.0–400.0)
RBC: 4.98 Mil/uL (ref 3.87–5.11)
RDW: 16 % — ABNORMAL HIGH (ref 11.5–15.5)
WBC: 8.3 10*3/uL (ref 4.0–10.5)

## 2023-10-23 LAB — LIPID PANEL
Cholesterol: 219 mg/dL — ABNORMAL HIGH (ref 0–200)
HDL: 32.5 mg/dL — ABNORMAL LOW (ref >39.00)
LDL Cholesterol: 156 mg/dL — ABNORMAL HIGH (ref 0–99)
NonHDL: 186.07
Total CHOL/HDL Ratio: 7
Triglycerides: 149 mg/dL (ref 0.0–149.0)
VLDL: 29.8 mg/dL (ref 0.0–40.0)

## 2023-10-23 LAB — COMPREHENSIVE METABOLIC PANEL WITH GFR
ALT: 17 U/L (ref 0–35)
AST: 15 U/L (ref 0–37)
Albumin: 4 g/dL (ref 3.5–5.2)
Alkaline Phosphatase: 66 U/L (ref 39–117)
BUN: 23 mg/dL (ref 6–23)
CO2: 31 meq/L (ref 19–32)
Calcium: 9.2 mg/dL (ref 8.4–10.5)
Chloride: 97 meq/L (ref 96–112)
Creatinine, Ser: 0.96 mg/dL (ref 0.40–1.20)
GFR: 67.57 mL/min (ref >60.00)
Glucose, Bld: 86 mg/dL (ref 70–99)
Potassium: 4.1 meq/L (ref 3.5–5.1)
Sodium: 137 meq/L (ref 135–145)
Total Bilirubin: 0.4 mg/dL (ref 0.2–1.2)
Total Protein: 6.9 g/dL (ref 6.0–8.3)

## 2023-10-23 LAB — HEMOGLOBIN A1C: Hgb A1c MFr Bld: 5.8 % (ref 4.6–6.5)

## 2023-10-23 MED ORDER — MONTELUKAST SODIUM 10 MG PO TABS: ORAL_TABLET | ORAL | 3 refills | Status: AC

## 2023-10-23 MED ORDER — ROSUVASTATIN CALCIUM 20 MG PO TABS: 20.0000 mg | ORAL_TABLET | Freq: Every day | ORAL | 3 refills | Status: DC

## 2023-10-23 MED ORDER — DICLOFENAC SODIUM 1 % EX GEL: 2.0000 g | Freq: Four times a day (QID) | CUTANEOUS | 6 refills | Status: DC

## 2023-10-23 MED ORDER — CARVEDILOL 3.125 MG PO TABS: 3.1250 mg | ORAL_TABLET | Freq: Two times a day (BID) | ORAL | 3 refills | Status: DC

## 2023-10-23 MED ORDER — LISINOPRIL-HYDROCHLOROTHIAZIDE 20-12.5 MG PO TABS: 2.0000 | ORAL_TABLET | Freq: Every day | ORAL | 3 refills | Status: DC

## 2023-10-23 MED ORDER — CETIRIZINE HCL 10 MG PO TABS: 10.0000 mg | ORAL_TABLET | Freq: Every day | ORAL | 3 refills | Status: AC

## 2023-10-23 NOTE — Progress Notes (Signed)
 SUBJECTIVE:   Chief Complaint  Patient presents with   Annual Exam   HPI Presents for annual physical  Discussed the use of AI scribe software for clinical note transcription with the patient, who gave verbal consent to proceed.  History of Present Illness Sally Harmon is a 53 year old female who presents for an annual physical exam.  She has a history of hypertension, managed with amlodipine  5 mg and lisinopril . Her blood pressure has been stable, and she feels well since starting regular exercise, including daily walks and using a mini treadmill at home. She has increased her walking duration from 20 to 40 minutes, aiming for an hour.  She experiences leg swelling, which she associates with amlodipine  use. The swelling is minimal and not bothersome. No current leg swelling is noted.  Her last mammogram was in 2021, and she is due for another. Her Pap smear is up to date until 2029, and her colonoscopy is not due until 2033. She has not received the pneumonia vaccine and is considering the shingles vaccine.  She has been fasting since 8 PM the previous night and had black coffee in the morning in preparation for lab work. No symptoms of constipation, diarrhea, blood in stool, or blood in urine.  Review of Systems - Negative except listed above    PERTINENT PMH / PSH: As above  OBJECTIVE:  BP 130/82   Pulse 76   Temp 98 F (36.7 C)   Resp 20   Ht 5\' 5"  (1.651 m)   Wt 288 lb 4 oz (130.7 kg)   SpO2 98%   BMI 47.97 kg/m    Physical Exam Vitals reviewed.  Constitutional:      General: She is not in acute distress.    Appearance: She is obese. She is not ill-appearing.  HENT:     Head: Normocephalic.     Right Ear: Tympanic membrane, ear canal and external ear normal.     Left Ear: Tympanic membrane, ear canal and external ear normal.     Nose: Nose normal.     Mouth/Throat:     Mouth: Mucous membranes are moist.  Eyes:     Extraocular Movements: Extraocular  movements intact.     Conjunctiva/sclera: Conjunctivae normal.     Pupils: Pupils are equal, round, and reactive to light.  Neck:     Thyroid: No thyromegaly or thyroid tenderness.     Vascular: No carotid bruit.  Cardiovascular:     Rate and Rhythm: Normal rate and regular rhythm.     Pulses: Normal pulses.     Heart sounds: Normal heart sounds.  Pulmonary:     Effort: Pulmonary effort is normal.     Breath sounds: Normal breath sounds.  Abdominal:     General: Bowel sounds are normal. There is no distension.     Palpations: Abdomen is soft.     Tenderness: There is no abdominal tenderness. There is no right CVA tenderness, left CVA tenderness, guarding or rebound.  Musculoskeletal:        General: Normal range of motion.     Cervical back: Normal range of motion.     Right lower leg: No edema.     Left lower leg: No edema.  Lymphadenopathy:     Cervical: No cervical adenopathy.  Skin:    Capillary Refill: Capillary refill takes less than 2 seconds.  Neurological:     General: No focal deficit present.     Mental Status:  She is alert and oriented to person, place, and time. Mental status is at baseline.     Motor: No weakness.  Psychiatric:        Mood and Affect: Mood normal.        Behavior: Behavior normal.        Thought Content: Thought content normal.        Judgment: Judgment normal.           10/23/2023    8:00 AM 06/08/2023    1:04 PM 09/24/2022   10:33 AM 01/17/2022    1:41 PM 09/07/2020    2:47 PM  Depression screen PHQ 2/9  Decreased Interest 0 0 0 0 0  Down, Depressed, Hopeless 0 0 0 0 0  PHQ - 2 Score 0 0 0 0 0  Altered sleeping 0 0 0    Tired, decreased energy 0 0 0    Change in appetite 0 0 0    Feeling bad or failure about yourself  0 0 0    Trouble concentrating 0 0 0    Moving slowly or fidgety/restless 0 0 0    Suicidal thoughts 0 0 0    PHQ-9 Score 0 0 0    Difficult doing work/chores Not difficult at all Not difficult at all Not difficult at  all        10/23/2023    8:00 AM 06/08/2023    1:05 PM 09/24/2022   10:33 AM 09/07/2019    3:48 PM  GAD 7 : Generalized Anxiety Score  Nervous, Anxious, on Edge 0 0 0 0  Control/stop worrying 0 0 0 0  Worry too much - different things 0 0 0 0  Trouble relaxing 0 0 0 0  Restless 0 0 0 0  Easily annoyed or irritable 0 0 0 0  Afraid - awful might happen 0 0 0 0  Total GAD 7 Score 0 0 0 0  Anxiety Difficulty Not difficult at all Not difficult at all Not difficult at all Not difficult at all    ASSESSMENT/PLAN:  Annual physical exam Assessment & Plan: Hepatitis C/HIV screening completed Tetanus vaccine.   Recommend shingles and PCV 20 vaccine.   Mammogram due.  Referral placed.  Patient to call to schedule appointment. Recommend self breast exams regularly. No indication for yearly clinical breast exam Pap up to date Colonoscopy up-to-date.  Removal of polyps.  Biopsy confirmed serrated polyp x 1.  Recommend follow-up in 7-year.  Due 03/2029 Educated regarding smoking cessation.  1 800 now quit provided.  10 yr pack history.   Depression/GAD completed   Essential hypertension Assessment & Plan: Blood pressure controlled at 130/82 mmHg with amlodipine  and lisinopril . Mild leg edema likely from amlodipine . - Discontinue Amlodipine  due to peripheral edema - Start Carvedilol  3.125 mg BID - Monitor BP at home.  Goal <150/90.  Notify MD if increasing. - Schedule follow-up in 6 months to reassess blood pressure.  Orders: -     CBC with Differential/Platelet -     Comprehensive metabolic panel with GFR -     Lisinopril -hydroCHLOROthiazide ; Take 2 tablets by mouth daily. In am  Dispense: 180 tablet; Refill: 3 -     Carvedilol ; Take 1 tablet (3.125 mg total) by mouth 2 (two) times daily with a meal.  Dispense: 60 tablet; Refill: 3  Mixed hyperlipidemia Assessment & Plan: Tolerating statin - Refill Crestor  20 mg daily - Recheck fasting lipids  Orders: -     Lipid  panel -      Rosuvastatin  Calcium ; Take 1 tablet (20 mg total) by mouth daily.  Dispense: 90 tablet; Refill: 3  Morbid obesity with BMI of 45.0-49.9, adult (HCC) Assessment & Plan: BMI elevated. Has lost 3lbs since last visit.  Has resumed work out at gym and choosing healthy foods. Continues to struggle with weight loss Information for healthy weight and wellness provided.  Would benefit from program.      Vitamin B 12 deficiency Assessment & Plan: Check Vitamin B 12 level  Orders: -     Vitamin B12  Vitamin D  deficiency Assessment & Plan: Check Vitamin D  level  Orders: -     VITAMIN D  25 Hydroxy (Vit-D Deficiency, Fractures)  Abnormal glucose -     Hemoglobin A1c  Breast cancer screening by mammogram -     3D Screening Mammogram, Left and Right; Future  Allergic rhinitis, unspecified seasonality, unspecified trigger Assessment & Plan: Refill Zyrtec  Refill Singulair   Orders: -     Cetirizine  HCl; Take 1 tablet (10 mg total) by mouth daily.  Dispense: 90 tablet; Refill: 3 -     Montelukast  Sodium; TAKE 1 TABLET BY MOUTH EVERYDAY AT BEDTIME  Dispense: 90 tablet; Refill: 3  Chronic pain of both knees Assessment & Plan: Refill Diclofenac  gel  Orders: -     Diclofenac  Sodium; Apply 2 g topically 4 (four) times daily.  Dispense: 150 g; Refill: 6      PDMP reviewed  Return in about 6 months (around 04/23/2024), or if symptoms worsen or fail to improve, for PCP.  Valli Gaw, MD

## 2023-10-23 NOTE — Patient Instructions (Addendum)
 It was a pleasure meeting you today. Thank you for allowing me to take part in your health care.  Our goals for today as we discussed include:  We will get some labs today.  If they are abnormal or we need to do something about them, I will call you.  If they are normal, I will send you a message on MyChart (if it is active) or a letter in the mail.  If you don't hear from us  in 2 weeks, please call the office at the number below.   Referral sent for Mammogram. Please call to schedule appointment. [facility] [address] [place], [place] [address] [phone]    Recommend Pneumonia 20 vaccine. This can be given at your local pharmacy  Recommend Shingles vaccine.  This is a 2 dose series and can be given at your local pharmacy.    Refills sent for requested medications. Stop Amlodipine  Start Carvedilol 3.125 mg two times a day Monitor blood pressure.  If increases >140/90 please notify MD and will send  Look at [facility] website to see if this may be an option for you with your weight loss. [facility] [address] [place] [phone]   Can call to schedule appointment or let me know if interested and need a referral.   This is a list of the screening recommended for you and due dates:  Health Maintenance  Topic Date Due   Pneumococcal Vaccination (1 of 2 - PCV) Never done   Zoster (Shingles) Vaccine (1 of 2) Never done   Mammogram  12/06/2020   COVID-19 Vaccine (3 - 2024-25 season) 01/18/2023   Flu Shot  12/18/2023   Pap with HPV screening  10/20/2027   Colon Cancer Screening  03/24/2029   DTaP/Tdap/Td vaccine (2 - Td or Tdap) 10/19/2032   Hepatitis C Screening  Completed   HIV Screening  Completed   HPV Vaccine  Aged Out   Meningitis B Vaccine  Aged Out      If you have any questions or concerns, please do not hesitate to call the office at [phone].  I look forward to our next visit and until  then take care and stay safe.  Regards,   Valli Gaw, MD   [facility]

## 2023-10-25 ENCOUNTER — Encounter: Payer: Self-pay | Admitting: Family Medicine

## 2023-10-25 DIAGNOSIS — R7309 Other abnormal glucose: Secondary | ICD-10-CM | POA: Insufficient documentation

## 2023-10-25 DIAGNOSIS — E538 Deficiency of other specified B group vitamins: Secondary | ICD-10-CM | POA: Insufficient documentation

## 2023-10-25 NOTE — Assessment & Plan Note (Signed)
 Check Vitamin D level

## 2023-10-25 NOTE — Assessment & Plan Note (Signed)
 Refill Zyrtec  Refill Singulair 

## 2023-10-25 NOTE — Assessment & Plan Note (Signed)
Refill Diclofenac gel °

## 2023-10-25 NOTE — Assessment & Plan Note (Signed)
 BMI elevated. Has lost 3lbs since last visit.  Has resumed work out at gym and choosing healthy foods. Continues to struggle with weight loss Information for healthy weight and wellness provided.  Would benefit from program.

## 2023-10-25 NOTE — Assessment & Plan Note (Signed)
 Check Vitamin B 12 level

## 2023-10-25 NOTE — Assessment & Plan Note (Signed)
 Hepatitis C/HIV screening completed Tetanus vaccine.   Recommend shingles and PCV 20 vaccine.   Mammogram due.  Referral placed.  Patient to call to schedule appointment. Recommend self breast exams regularly. No indication for yearly clinical breast exam Pap up to date Colonoscopy up-to-date.  Removal of polyps.  Biopsy confirmed serrated polyp x 1.  Recommend follow-up in 7-year.  Due 03/2029 Educated regarding smoking cessation.  1 800 now quit provided.  10 yr pack history.   Depression/GAD completed

## 2023-10-25 NOTE — Assessment & Plan Note (Signed)
 Tolerating statin - Refill Crestor  20 mg daily - Recheck fasting lipids

## 2023-10-25 NOTE — Assessment & Plan Note (Signed)
 Blood pressure controlled at 130/82 mmHg with amlodipine  and lisinopril . Mild leg edema likely from amlodipine . - Discontinue Amlodipine  due to peripheral edema - Start Carvedilol  3.125 mg BID - Monitor BP at home.  Goal <150/90.  Notify MD if increasing. - Schedule follow-up in 6 months to reassess blood pressure.

## 2023-10-29 ENCOUNTER — Ambulatory Visit: Payer: Self-pay | Admitting: Family Medicine

## 2023-10-29 DIAGNOSIS — E782 Mixed hyperlipidemia: Secondary | ICD-10-CM

## 2023-10-29 DIAGNOSIS — E538 Deficiency of other specified B group vitamins: Secondary | ICD-10-CM

## 2023-10-29 LAB — VITAMIN D 25 HYDROXY (VIT D DEFICIENCY, FRACTURES): VITD: 37.29 ng/mL (ref 30.00–100.00)

## 2023-10-29 LAB — VITAMIN B12: Vitamin B-12: 109 pg/mL — ABNORMAL LOW (ref 211–911)

## 2023-10-29 MED ORDER — VITAMIN B-12 1000 MCG PO TABS: 2000.0000 ug | ORAL_TABLET | Freq: Every day | ORAL | 3 refills | Status: AC

## 2023-10-29 MED ORDER — ROSUVASTATIN CALCIUM 40 MG PO TABS: 40.0000 mg | ORAL_TABLET | Freq: Every day | ORAL | 3 refills | Status: DC

## 2023-10-30 DIAGNOSIS — G5601 Carpal tunnel syndrome, right upper limb: Secondary | ICD-10-CM | POA: Diagnosis not present

## 2023-10-30 DIAGNOSIS — E669 Obesity, unspecified: Secondary | ICD-10-CM | POA: Diagnosis not present

## 2023-10-30 DIAGNOSIS — Z6841 Body Mass Index (BMI) 40.0 and over, adult: Secondary | ICD-10-CM | POA: Diagnosis not present

## 2024-02-17 DIAGNOSIS — R7309 Other abnormal glucose: Secondary | ICD-10-CM | POA: Diagnosis not present

## 2024-03-19 DIAGNOSIS — R7309 Other abnormal glucose: Secondary | ICD-10-CM | POA: Diagnosis not present

## 2024-04-27 ENCOUNTER — Encounter: Payer: Self-pay | Admitting: Internal Medicine

## 2024-04-27 ENCOUNTER — Ambulatory Visit: Admitting: Internal Medicine

## 2024-04-27 VITALS — BP 130/84 | HR 67 | Temp 98.2°F | Ht 65.0 in | Wt 282.2 lb

## 2024-04-27 DIAGNOSIS — E538 Deficiency of other specified B group vitamins: Secondary | ICD-10-CM | POA: Diagnosis not present

## 2024-04-27 DIAGNOSIS — Z1231 Encounter for screening mammogram for malignant neoplasm of breast: Secondary | ICD-10-CM

## 2024-04-27 DIAGNOSIS — I10 Essential (primary) hypertension: Secondary | ICD-10-CM | POA: Diagnosis not present

## 2024-04-27 DIAGNOSIS — Z6841 Body Mass Index (BMI) 40.0 and over, adult: Secondary | ICD-10-CM | POA: Diagnosis not present

## 2024-04-27 DIAGNOSIS — E559 Vitamin D deficiency, unspecified: Secondary | ICD-10-CM | POA: Diagnosis not present

## 2024-04-27 DIAGNOSIS — E782 Mixed hyperlipidemia: Secondary | ICD-10-CM

## 2024-04-27 MED ORDER — LISINOPRIL-HYDROCHLOROTHIAZIDE 20-12.5 MG PO TABS: 2.0000 | ORAL_TABLET | Freq: Every day | ORAL | 3 refills | Status: AC

## 2024-04-27 MED ORDER — ROSUVASTATIN CALCIUM 40 MG PO TABS: 40.0000 mg | ORAL_TABLET | Freq: Every day | ORAL | 3 refills | Status: AC

## 2024-04-27 NOTE — Assessment & Plan Note (Signed)
-   This problem is chronic and stable -Patient blood pressure today is at goal at 130/84 -She is currently on lisinopril /HCTZ 20/12.5 mg 2 tabs once a day -Will continue current medication regimen for now -She did have a BMP earlier this year and does not require a repeat at this time -I have asked her to follow-up with her new PCP in 3 months

## 2024-04-27 NOTE — Assessment & Plan Note (Signed)
-   This problem is chronic  -Patient with B12 deficiency noted on last blood work -She is currently on vitamin B12 oral supplementation -Will recheck vitamin B12 levels today -No further workup at this time

## 2024-04-27 NOTE — Assessment & Plan Note (Signed)
-   Patient with a history of previous vitamin D  deficiency which normalized with vitamin D  supplementation -Will recheck her vitamin D  level today since she is now off vitamin D  supplementation - No further workup at this time

## 2024-04-27 NOTE — Assessment & Plan Note (Signed)
-   Patient is due for mammogram screening -Will reorder this for her today -She is agreeable for this -No further workup at this time

## 2024-04-27 NOTE — Assessment & Plan Note (Signed)
-  This problem is chronic and fully improving - Patient's BMI is still elevated at 46 but she is down approximately 9 pounds since January -She will continue with current diet and excise regimen -Does not want referral to healthy weight and wellness at this time but will consider this and let us  know if she wants this -No further workup at this time

## 2024-04-27 NOTE — Patient Instructions (Signed)
°  VISIT SUMMARY: During your follow-up visit, we discussed your hypertension, weight management, recent carpal tunnel surgery, knee pain, and vitamin supplementation. We reviewed your current medications and made some adjustments to ensure your health is well-managed.  YOUR PLAN: -ESSENTIAL HYPERTENSION: Hypertension is high blood pressure. Your blood pressure is well-controlled with your current medications, lisinopril  and hydrochlorothiazide . We will continue with these medications since you had side effects with amlodipine  and are not currently taking carvedilol .  -HYPERLIPIDEMIA: Hyperlipidemia is high cholesterol. Your cholesterol levels were previously high, so we increased your Crestor  dosage to 40 mg. We have ordered a cholesterol panel to see how well the higher dose is working and kidney function tests to monitor the effects of your blood pressure medications.  -VITAMIN B12 DEFICIENCY: Vitamin B12 deficiency means you have low levels of vitamin B12. You have been taking supplements, and we have ordered a vitamin B12 level test to see if the supplements are effective.  -OBESITY: Obesity is having excess body weight. You have successfully lost 15-20 pounds through diet and exercise. Continue with your current regimen, and if you ever want additional support, a referral to a weight clinic is available.  INSTRUCTIONS: Please complete the cholesterol panel and kidney function tests as ordered. Also, have your vitamin B12 level checked to ensure your supplements are working. Continue taking your current medications and following your diet and exercise plan. If you have any concerns or need further assistance, feel free to reach out.                      Contains text generated by [employer].                                 Contains text generated by [employer].

## 2024-04-27 NOTE — Progress Notes (Signed)
 Acute Office Visit  Subjective:     Patient ID: Sally Harmon, female    DOB: 01/01/1971, 53 y.o.   MRN: [id number]  Chief Complaint  Patient presents with   Medical Management of Chronic Issues   Discussed the use of AI scribe software for clinical note transcription with the patient, who gave verbal consent to proceed.  History of Present Illness Sally Harmon is a 53 year old female with hypertension who presents for a follow-up visit.  Hypertension and medication intolerance - Previously discontinued amlodipine  due to lower extremity swelling and headaches. - No current swelling in her legs since stopping amlodipine . - Has also stopped taking carvedilol . - Prefers minimal medication use.  Weight management and lifestyle modification - Actively engaged in weight loss through diet and exercise. - Diet includes protein shakes, boiled eggs, and fruits. - Has a history of significant weight loss through similar dietary habits and physical activity. - Previously discontinued weight loss medication after reaching target weight.  Carpal tunnel syndrome status post surgery - Recently underwent carpal tunnel surgery. - Significant improvement in symptoms postoperatively. - No longer experiences nighttime awakenings previously caused by carpal tunnel symptoms.  Knee arthralgia - Knee pain, especially in bad weather. - Family history of arthritis. - Previously tried Voltaren  gel for joint pain without benefit. - Believes further weight loss will help alleviate knee pain. - Not currently using Voltaren  gel.  Vitamin supplementation - Not currently taking vitamin D  supplements as levels have normalized with previous supplementation. She is currently on vitamin B12 supplements  Medication and dietary adherence - Disciplined in following medication regimen and dietary recommendations.    Review of Systems  Constitutional: Negative.   HENT: Negative.    Respiratory:  Negative.    Cardiovascular: Negative.   Gastrointestinal: Negative.   Musculoskeletal:  Positive for joint pain.  Neurological: Negative.   Psychiatric/Behavioral: Negative.          Objective:    BP 130/84   Pulse 67   Temp 98.2 F (36.8 C)   Ht 5' 5 (1.651 m)   Wt 282 lb 3.2 oz (128 kg)   SpO2 99%   BMI 46.96 kg/m    Physical Exam HENT:     Head: Normocephalic and atraumatic.  Cardiovascular:     Rate and Rhythm: Normal rate and regular rhythm.     Heart sounds: Normal heart sounds.  Pulmonary:     Effort: Pulmonary effort is normal.     Breath sounds: Normal breath sounds. No wheezing, rhonchi or rales.  Abdominal:     General: Bowel sounds are normal. There is no distension.     Palpations: Abdomen is soft.     Tenderness: There is no abdominal tenderness. There is no guarding or rebound.  Musculoskeletal:        General: No swelling or tenderness.     Right lower leg: No edema.     Left lower leg: No edema.  Neurological:     Mental Status: She is alert.  Psychiatric:        Mood and Affect: Mood normal.        Behavior: Behavior normal.     No results found for any visits on 04/27/24.      Assessment & Plan:   Problem List Items Addressed This Visit       Cardiovascular and Mediastinum   Essential hypertension   - This problem is chronic and stable -Patient blood pressure today is at  goal at 130/84 -She is currently on lisinopril /HCTZ 20/12.5 mg 2 tabs once a day -Will continue current medication regimen for now -She did have a BMP earlier this year and does not require a repeat at this time -I have asked her to follow-up with her new PCP in 3 months      Relevant Medications   lisinopril -hydrochlorothiazide  (ZESTORETIC ) 20-12.5 MG tablet   rosuvastatin  (CRESTOR ) 40 MG tablet     Other   Breast cancer screening by mammogram   - Patient is due for mammogram screening -Will reorder this for her today -She is agreeable for this -No  further workup at this time      Relevant Orders   MM 3D SCREENING MAMMOGRAM BILATERAL BREAST   Hyperlipidemia   - This problem is chronic and stable -Patient was previously on Crestor  20 mg but had an elevated LDL in the 150s and had her Crestor  increased to 40 mg daily -Patient was unaware of the increased dose of the Crestor  and has not been taking any cholesterol medications since her last visit -Will refill Crestor  40 mg daily and recheck her lipid panel today -Patient to follow-up in 3 months with her new PCP      Relevant Medications   lisinopril -hydrochlorothiazide  (ZESTORETIC ) 20-12.5 MG tablet   rosuvastatin  (CRESTOR ) 40 MG tablet   Other Relevant Orders   Lipid panel   Morbid obesity with BMI of 45.0-49.9, adult (HCC) - Primary   -This problem is chronic and fully improving - Patient's BMI is still elevated at 46 but she is down approximately 9 pounds since January -She will continue with current diet and excise regimen -Does not want referral to healthy weight and wellness at this time but will consider this and let us  know if she wants this -No further workup at this time      Vitamin B 12 deficiency   - This problem is chronic  -Patient with B12 deficiency noted on last blood work -She is currently on vitamin B12 oral supplementation -Will recheck vitamin B12 levels today -No further workup at this time      Relevant Orders   Vitamin B12   Vitamin D  deficiency   - Patient with a history of previous vitamin D  deficiency which normalized with vitamin D  supplementation -Will recheck her vitamin D  level today since she is now off vitamin D  supplementation - No further workup at this time      Relevant Orders   VITAMIN D  25 Hydroxy (Vit-D Deficiency, Fractures)    Meds ordered this encounter  Medications   lisinopril -hydrochlorothiazide  (ZESTORETIC ) 20-12.5 MG tablet    Sig: Take 2 tablets by mouth daily. In am    Dispense:  180 tablet    Refill:  3    rosuvastatin  (CRESTOR ) 40 MG tablet    Sig: Take 1 tablet (40 mg total) by mouth daily.    Dispense:  90 tablet    Refill:  3    No follow-ups on file.  Lakishia Bourassa, MD

## 2024-04-27 NOTE — Assessment & Plan Note (Signed)
-   This problem is chronic and stable -Patient was previously on Crestor  20 mg but had an elevated LDL in the 150s and had her Crestor  increased to 40 mg daily -Patient was unaware of the increased dose of the Crestor  and has not been taking any cholesterol medications since her last visit -Will refill Crestor  40 mg daily and recheck her lipid panel today -Patient to follow-up in 3 months with her new PCP

## 2024-04-28 ENCOUNTER — Ambulatory Visit: Payer: Self-pay | Admitting: Internal Medicine

## 2024-04-28 DIAGNOSIS — E782 Mixed hyperlipidemia: Secondary | ICD-10-CM

## 2024-04-28 LAB — LIPID PANEL
Cholesterol: 181 mg/dL (ref 0–200)
HDL: 31.1 mg/dL — ABNORMAL LOW (ref >39.00)
LDL Cholesterol: 117 mg/dL — ABNORMAL HIGH (ref 0–99)
NonHDL: 150.07
Total CHOL/HDL Ratio: 6
Triglycerides: 165 mg/dL — ABNORMAL HIGH (ref 0.0–149.0)
VLDL: 33 mg/dL (ref 0.0–40.0)

## 2024-04-28 LAB — VITAMIN D 25 HYDROXY (VIT D DEFICIENCY, FRACTURES): VITD: 35.67 ng/mL (ref 30.00–100.00)

## 2024-04-28 LAB — VITAMIN B12: Vitamin B-12: 253 pg/mL (ref 211–911)

## 2024-04-28 NOTE — Assessment & Plan Note (Signed)
-   Lipid panel noted - Patient with an elevated 10-year ASCVD risk score of 7.9% -Patient will continue with Crestor  40 mg daily -Will recheck lipid panel in 3 months with her new PCP

## 2024-07-01 ENCOUNTER — Encounter

## 2025-10-23 ENCOUNTER — Ambulatory Visit: Payer: BC Managed Care – PPO | Admitting: Family Medicine
